# Patient Record
Sex: Female | Born: 1945 | Race: Black or African American | Hispanic: No | Marital: Married | Smoking: Never smoker
Health system: Southern US, Community
[De-identification: ages and names within clinical notes are randomized; demographics above are authoritative.]

## PROBLEM LIST (undated history)

## (undated) DIAGNOSIS — D649 Anemia, unspecified: Secondary | ICD-10-CM

## (undated) DIAGNOSIS — E039 Hypothyroidism, unspecified: Secondary | ICD-10-CM

## (undated) DIAGNOSIS — I10 Essential (primary) hypertension: Secondary | ICD-10-CM

## (undated) DIAGNOSIS — G709 Myoneural disorder, unspecified: Secondary | ICD-10-CM

## (undated) DIAGNOSIS — E119 Type 2 diabetes mellitus without complications: Secondary | ICD-10-CM

---

## 2001-11-25 ENCOUNTER — Ambulatory Visit (HOSPITAL_COMMUNITY): Admission: RE | Admit: 2001-11-25 | Discharge: 2001-11-25 | Payer: Self-pay | Admitting: Gastroenterology

## 2005-12-04 ENCOUNTER — Encounter: Admission: RE | Admit: 2005-12-04 | Discharge: 2005-12-04 | Payer: Self-pay | Admitting: Internal Medicine

## 2005-12-26 ENCOUNTER — Encounter: Admission: RE | Admit: 2005-12-26 | Discharge: 2005-12-26 | Payer: Self-pay | Admitting: Internal Medicine

## 2010-06-17 ENCOUNTER — Encounter: Payer: Self-pay | Admitting: Internal Medicine

## 2010-07-20 ENCOUNTER — Encounter (HOSPITAL_COMMUNITY)
Admission: RE | Admit: 2010-07-20 | Discharge: 2010-07-20 | Disposition: A | Discharge status: Home / Self Care | Payer: Self-pay | Source: Ambulatory Visit

## 2010-07-20 ENCOUNTER — Encounter (HOSPITAL_COMMUNITY)
Admission: RE | Admit: 2010-07-20 | Discharge: 2010-07-20 | Disposition: A | Discharge status: Home / Self Care | Payer: Self-pay | Source: Ambulatory Visit | Attending: Ophthalmology | Admitting: Ophthalmology

## 2010-07-20 ENCOUNTER — Inpatient Hospital Stay (INDEPENDENT_AMBULATORY_CARE_PROVIDER_SITE_OTHER)
Admission: RE | Admit: 2010-07-20 | Discharge: 2010-07-20 | Disposition: A | Discharge status: Home / Self Care | Payer: Self-pay | Source: Ambulatory Visit | Attending: Family Medicine | Admitting: Family Medicine

## 2010-07-20 ENCOUNTER — Emergency Department (HOSPITAL_COMMUNITY)
Admission: EM | Admit: 2010-07-20 | Discharge: 2010-07-21 | Disposition: A | Discharge status: Home / Self Care | Payer: Self-pay | Attending: Emergency Medicine | Admitting: Emergency Medicine

## 2010-07-20 ENCOUNTER — Other Ambulatory Visit (HOSPITAL_COMMUNITY): Payer: Self-pay | Admitting: Ophthalmology

## 2010-07-20 DIAGNOSIS — Z01811 Encounter for preprocedural respiratory examination: Secondary | ICD-10-CM

## 2010-07-20 DIAGNOSIS — E119 Type 2 diabetes mellitus without complications: Secondary | ICD-10-CM | POA: Insufficient documentation

## 2010-07-20 DIAGNOSIS — Z01812 Encounter for preprocedural laboratory examination: Secondary | ICD-10-CM | POA: Insufficient documentation

## 2010-07-20 DIAGNOSIS — Z01810 Encounter for preprocedural cardiovascular examination: Secondary | ICD-10-CM | POA: Insufficient documentation

## 2010-07-20 DIAGNOSIS — Z01818 Encounter for other preprocedural examination: Secondary | ICD-10-CM | POA: Insufficient documentation

## 2010-07-20 DIAGNOSIS — I10 Essential (primary) hypertension: Secondary | ICD-10-CM | POA: Insufficient documentation

## 2010-07-20 DIAGNOSIS — R7989 Other specified abnormal findings of blood chemistry: Secondary | ICD-10-CM

## 2010-07-20 LAB — BASIC METABOLIC PANEL
BUN: 18 mg/dL (ref 6–23)
BUN: 15 mg/dL (ref 6–23)
CO2: 24 mEq/L (ref 19–32)
CO2: 22 mEq/L (ref 19–32)
Calcium: 10 mg/dL (ref 8.4–10.5)
Calcium: 9.3 mg/dL (ref 8.4–10.5)
Chloride: 98 mEq/L (ref 96–112)
Creatinine, Ser: 0.86 mg/dL (ref 0.4–1.2)
GFR calc Af Amer: >60 mL/min (ref >60)
GFR calc non Af Amer: >60 mL/min (ref >60)
Glucose, Bld: 472 mg/dL — ABNORMAL HIGH (ref 70–99)
Potassium: 4.7 mEq/L (ref 3.5–5.1)
Sodium: 132 mEq/L — ABNORMAL LOW (ref 135–145)

## 2010-07-20 LAB — CBC
HCT: 36.9 % (ref 36.0–46.0)
Hemoglobin: 13.2 g/dL (ref 12.0–15.0)
MCH: 31 pg (ref 26.0–34.0)
MCHC: 35.2 g/dL (ref 30.0–36.0)
MCV: 87.2 fL (ref 78.0–100.0)
RBC: 4.29 MIL/uL (ref 3.87–5.11)
RDW: 13 % (ref 11.5–15.5)
WBC: 8 10*3/uL (ref 4.0–10.5)

## 2010-07-20 LAB — URINALYSIS, ROUTINE W REFLEX MICROSCOPIC
Bilirubin Urine: NEGATIVE
Ketones, ur: 15 mg/dL — AB
Protein, ur: 30 mg/dL — AB
Urine Glucose, Fasting: >1000 mg/dL — AB

## 2010-07-20 LAB — SURGICAL PCR SCREEN: Staphylococcus aureus: NEGATIVE

## 2010-07-20 LAB — POCT I-STAT 3, ART BLOOD GAS (G3+)
O2 Saturation: 95 %
Patient temperature: 98.6
pCO2 arterial: 37.3 mmHg (ref 35.0–45.0)
pO2, Arterial: 72 mmHg — ABNORMAL LOW (ref 80.0–100.0)

## 2010-07-20 LAB — DIFFERENTIAL
Basophils Absolute: 0.1 10*3/uL (ref 0.0–0.1)
Eosinophils Absolute: 0 10*3/uL (ref 0.0–0.7)
Eosinophils Relative: 0 % (ref 0–5)
Lymphocytes Relative: 57 % — ABNORMAL HIGH (ref 12–46)
Lymphs Abs: 4.6 10*3/uL — ABNORMAL HIGH (ref 0.7–4.0)

## 2010-07-20 LAB — URINE MICROSCOPIC-ADD ON

## 2010-07-20 LAB — GLUCOSE, CAPILLARY: Glucose-Capillary: 386 mg/dL — ABNORMAL HIGH (ref 70–99)

## 2010-07-20 LAB — POCT CARDIAC MARKERS
CKMB, poc: 2.7 ng/mL (ref 1.0–8.0)
Myoglobin, poc: 128 ng/mL (ref 12–200)

## 2010-07-23 ENCOUNTER — Ambulatory Visit (HOSPITAL_COMMUNITY)
Admission: RE | Admit: 2010-07-23 | Discharge: 2010-07-23 | Disposition: A | Discharge status: Home / Self Care | Payer: Self-pay | Source: Ambulatory Visit | Attending: Ophthalmology | Admitting: Ophthalmology

## 2010-07-23 DIAGNOSIS — T8529XA Other mechanical complication of intraocular lens, initial encounter: Secondary | ICD-10-CM | POA: Insufficient documentation

## 2010-07-23 DIAGNOSIS — Y831 Surgical operation with implant of artificial internal device as the cause of abnormal reaction of the patient, or of later complication, without mention of misadventure at the time of the procedure: Secondary | ICD-10-CM | POA: Insufficient documentation

## 2010-07-23 DIAGNOSIS — R7309 Other abnormal glucose: Secondary | ICD-10-CM | POA: Insufficient documentation

## 2010-07-23 DIAGNOSIS — Z01810 Encounter for preprocedural cardiovascular examination: Secondary | ICD-10-CM | POA: Insufficient documentation

## 2010-07-23 DIAGNOSIS — Z01818 Encounter for other preprocedural examination: Secondary | ICD-10-CM | POA: Insufficient documentation

## 2010-07-23 DIAGNOSIS — Z01812 Encounter for preprocedural laboratory examination: Secondary | ICD-10-CM | POA: Insufficient documentation

## 2010-07-23 DIAGNOSIS — Z5309 Procedure and treatment not carried out because of other contraindication: Secondary | ICD-10-CM | POA: Insufficient documentation

## 2010-07-23 LAB — GLUCOSE, CAPILLARY: Glucose-Capillary: 395 mg/dL — ABNORMAL HIGH (ref 70–99)

## 2010-10-25 ENCOUNTER — Encounter (HOSPITAL_COMMUNITY)
Admission: RE | Admit: 2010-10-25 | Discharge: 2010-10-25 | Disposition: A | Discharge status: Home / Self Care | Payer: Self-pay | Source: Ambulatory Visit | Attending: Ophthalmology | Admitting: Ophthalmology

## 2010-10-25 DIAGNOSIS — Z01812 Encounter for preprocedural laboratory examination: Secondary | ICD-10-CM | POA: Insufficient documentation

## 2010-10-25 DIAGNOSIS — Z01818 Encounter for other preprocedural examination: Secondary | ICD-10-CM | POA: Insufficient documentation

## 2010-10-25 LAB — BASIC METABOLIC PANEL
CO2: 27 mEq/L (ref 19–32)
Calcium: 9.9 mg/dL (ref 8.4–10.5)
Chloride: 99 mEq/L (ref 96–112)
Creatinine, Ser: 0.82 mg/dL (ref 0.4–1.2)
Glucose, Bld: 221 mg/dL — ABNORMAL HIGH (ref 70–99)
Potassium: 4.3 mEq/L (ref 3.5–5.1)

## 2010-10-25 LAB — CBC
MCH: 30.5 pg (ref 26.0–34.0)
MCHC: 34.6 g/dL (ref 30.0–36.0)
MCV: 88.1 fL (ref 78.0–100.0)
WBC: 10.4 10*3/uL (ref 4.0–10.5)

## 2010-10-25 LAB — SURGICAL PCR SCREEN: Staphylococcus aureus: NEGATIVE

## 2010-10-29 ENCOUNTER — Ambulatory Visit (HOSPITAL_COMMUNITY)
Admission: RE | Admit: 2010-10-29 | Discharge: 2010-10-29 | Disposition: A | Discharge status: Home / Self Care | Payer: Self-pay | Source: Ambulatory Visit | Attending: Ophthalmology | Admitting: Ophthalmology

## 2010-10-29 DIAGNOSIS — I10 Essential (primary) hypertension: Secondary | ICD-10-CM | POA: Insufficient documentation

## 2010-10-29 DIAGNOSIS — Y831 Surgical operation with implant of artificial internal device as the cause of abnormal reaction of the patient, or of later complication, without mention of misadventure at the time of the procedure: Secondary | ICD-10-CM | POA: Insufficient documentation

## 2010-10-29 DIAGNOSIS — E119 Type 2 diabetes mellitus without complications: Secondary | ICD-10-CM | POA: Insufficient documentation

## 2010-10-29 DIAGNOSIS — T8529XA Other mechanical complication of intraocular lens, initial encounter: Secondary | ICD-10-CM | POA: Insufficient documentation

## 2010-10-29 LAB — GLUCOSE, CAPILLARY
Glucose-Capillary: 197 mg/dL — ABNORMAL HIGH (ref 70–99)
Glucose-Capillary: 157 mg/dL — ABNORMAL HIGH (ref 70–99)

## 2010-11-14 NOTE — Op Note (Signed)
NAMEELLAJANE, STONG NO.:  [id number]  MEDICAL RECORD NO.:  [id number]  LOCATION:  [facility]                         FACILITY:  [facility]  PHYSICIAN:  Shade Flood, MD       DATE OF BIRTH:  08-Mar-1946  DATE OF PROCEDURE:  10/29/2010 DATE OF DISCHARGE:  10/29/2010                              OPERATIVE REPORT   PREOPERATIVE DIAGNOSIS:  Posterior dislocation of posterior chamber intraocular lens.  POSTOPERATIVE DIAGNOSIS:  Posterior dislocation of posterior chamber intraocular lens.  SURGEON:  Shade Flood, MD  PROCEDURE PERFORMED:  Pars plana vitrectomy with retrieval of posterior                       chamber intraocular lens,  secondary intraocular lens                       placement with suture fixation.   COMPLICATIONS: There were no complications  PATHOLOGY: no specimens for pathology  PROCEDURE: The patient's eye was prepared and draped in the usual fashion for ocular surgery on the right eye and a solid lid speculum was placed.  A trocar cannula was placed 3.5 mm posterior to the surgical limbus at 7:30 meridian.  The tip of the cannula was visualized in the vitreous cavity and the infusion line was allowed to flow and then clamped.  This was then attached to the cannula and secured to the drape.  Two additional trocar cannulas were placed at 9:30 and 2:30.  The light pipe and vitreous cutter were then inserted, and core vitrectomy was carried out.  The intraocular lens was  in part adherent to the vitreous base infero-temporally.   A peripheral clear corneal incision was made for 7 mm centered at the 12 o'clock meridian and then a keratome blade was used to enter the anterior chamber to permit delivery of the intraocular lens.  A 23-gauge forceps was then used to grasp the lens by its haptic and carefully retrieve it from the vitreous base.  The lens was removed from the vitreous base uneventfully.  The lens was then brought up to the iris plane and  a 20-gauge forceps was used to grasp it through the superior corneal incision and pupil.  It was delivered in that fashion from the eye through the anterior chamber.    Once the intraocular lens was removed, any residual vitreous was removed with the vitreous cutter preliminary to placing sutures.  Conjunctival peritomies were made from 1-2:30 and from 9:30-11 o'clock.  Sclera was cleaned and cauterized and two Chevron-shaped scleral flaps were dissected using a Beaver llamellar blade.  A 26-gauge needle was then placed underneath the flap through the sclera behind the iris and into the vitreous cavity. A trans-chamber 10-0 Prolene suture was then placed in similar position through the sclera on the opposite side of the eye and passed across the eye and threaded into the 26-gauge needle.  The 26g needle was withdrawn which served as a tract for the needle to come out of the temporal side of the eye.  The needle holder was then used grasp the transchamber  needle and the 10-0  prolene was left bridging the posterior pole.  A second trans-chamber needle was placed in similar fashion at the near side of the sclera underneath the flap.  Once the needle was retrieved on the other side, there were now two parallel 10-0 Prolene sutures bridging the eye.    A Sinskey lens hook was then used to bring both Prolene sutures out of the superior wound.  They were then severed and then tied to the intraocular lens haptic loops for suture placement.  The left side sutures were tied to the loop of one haptic and the temporal side sutures were tied to the loop of the other haptic.  Once these were secured, the intraocular lens was carefully reposited through the anterior chamber into the anterior most portion of the posterior chamber.  The Prolene sutures were then gradually brought up and the lens centered at the visual axis.  The Prolene sutures were then secured beneath the scleral flaps on each side  and the scleral flaps themselves were sewn closed with a 7-0 Vicryl suture.  Upon completion of this maneuver,  the cornea was sutured with interrupted 10-0 Prolene sutures.    The conjunctiva was then reapproximated using 6-0 plain gut suture, and the eye was checked for optimal pressure. The pressure was less than palpably.   The trocar cannulas at 9:30 and 2:30 were then removed with concomitant closure using a cotton tip applicator, and the infusion line was removed with concomitant closure with a cotton tip applicator.  The patient was given 100 mg of Ancef, irrigated subconjunctivally using an Olive tip cannula and 4 mg of Decadron irrigated subconjunctivally with an Olive tip cannula.  After completion of the injections, the lid speculums were removed and the patient's eye was patched using Polymyxin/Bacitracin ophthalmic ointment.  A plastic shield was placed and then she was uneventfully extubated and transferred to the recovery area with stable vital signs.          ______________________________ Shade Flood, MD     GG/MEDQ  D:  10/31/2010  T:  11/01/2010  Job:  [id number]  Electronically Signed by Shade Flood MD on 11/14/2010 03:42:31 PM

## 2012-05-27 IMAGING — CR DG CHEST 2V
2 series · 2 of 2 positions shown · non-contrast
Comparison: None.

CLINICAL DATA: Preop.

CHEST - 2 VIEW

[view not recorded (1 of 2)]
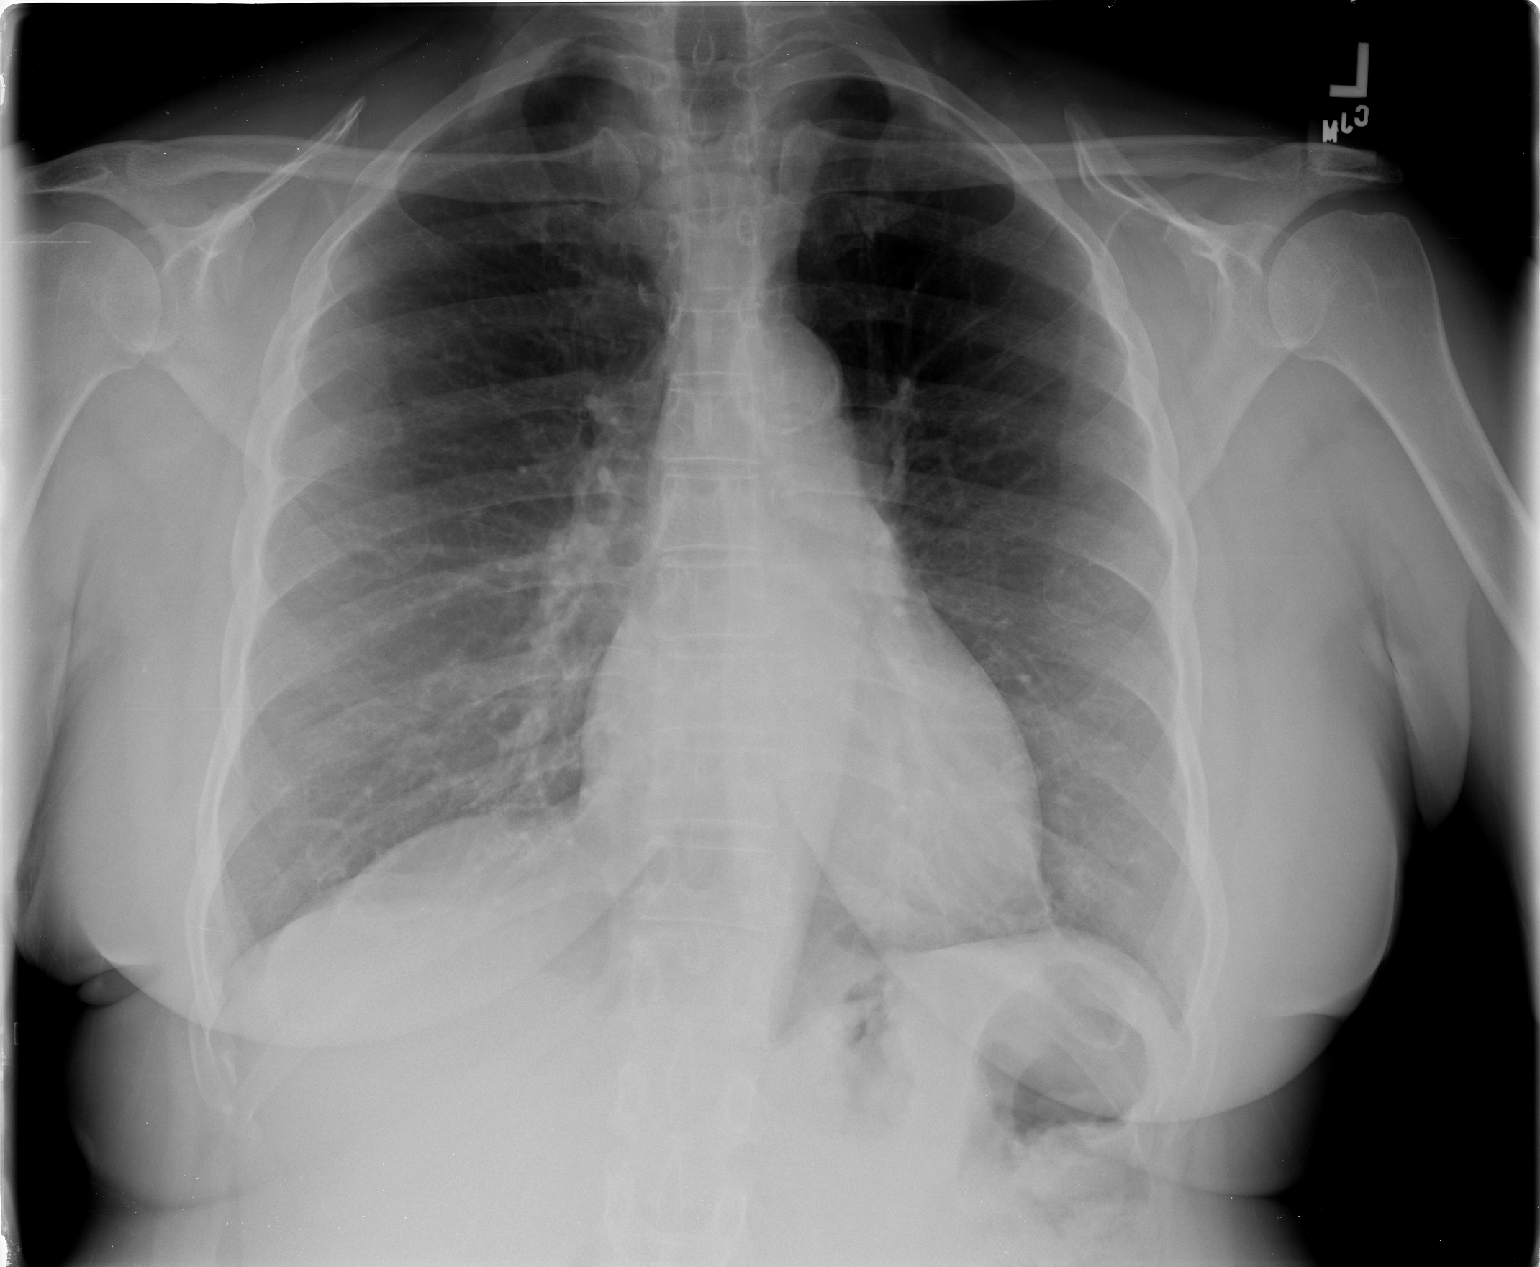

[view not recorded (2 of 2)]
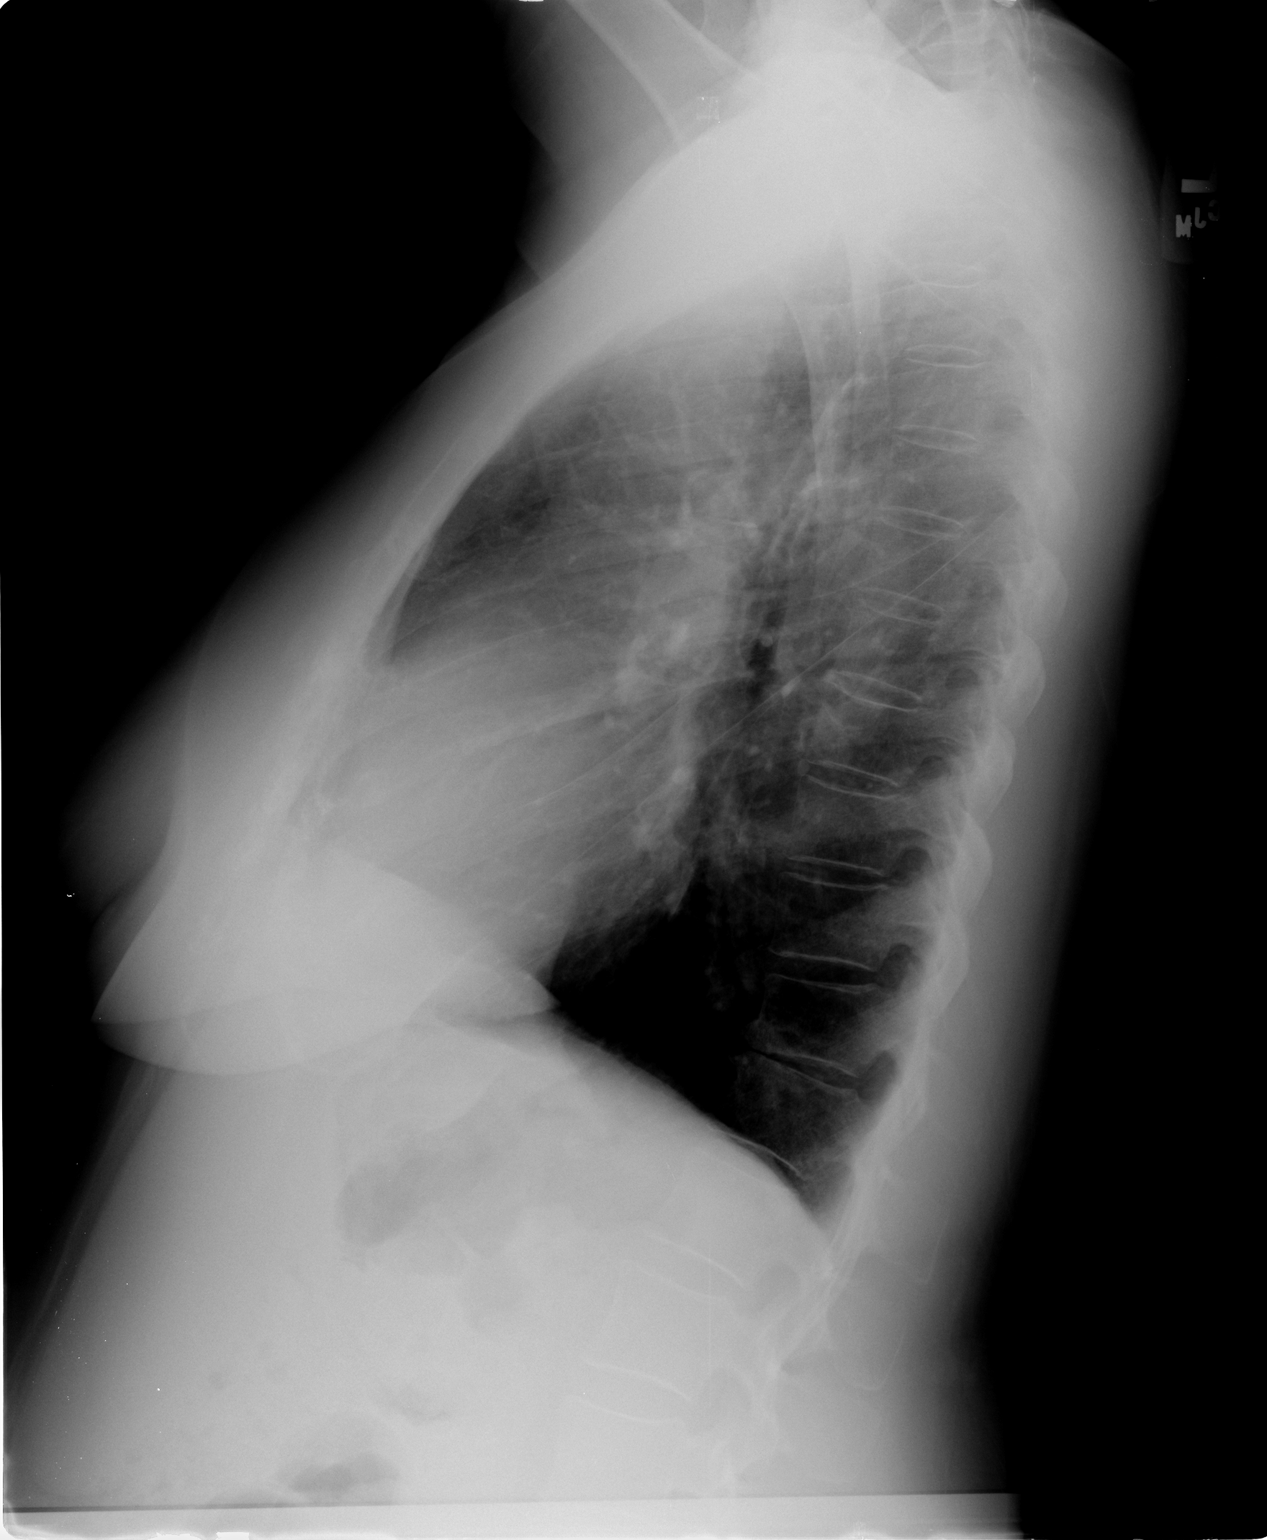

[2 of 2 positions shown; findings below may reference images not displayed]

FINDINGS: Trachea is midline.  Heart size normal.  Lungs are clear.
No pleural fluid.  Mild reversal of the normal thoracic kyphosis is
noted.
IMPRESSION: No acute findings.

## 2015-09-20 DIAGNOSIS — E113211 Type 2 diabetes mellitus with mild nonproliferative diabetic retinopathy with macular edema, right eye: Secondary | ICD-10-CM | POA: Diagnosis not present

## 2015-09-20 DIAGNOSIS — H353131 Nonexudative age-related macular degeneration, bilateral, early dry stage: Secondary | ICD-10-CM | POA: Diagnosis not present

## 2015-09-20 DIAGNOSIS — H35371 Puckering of macula, right eye: Secondary | ICD-10-CM | POA: Diagnosis not present

## 2015-09-20 DIAGNOSIS — H40053 Ocular hypertension, bilateral: Secondary | ICD-10-CM | POA: Diagnosis not present

## 2015-12-20 DIAGNOSIS — H353231 Exudative age-related macular degeneration, bilateral, with active choroidal neovascularization: Secondary | ICD-10-CM | POA: Diagnosis not present

## 2015-12-20 DIAGNOSIS — E083312 Diabetes mellitus due to underlying condition with moderate nonproliferative diabetic retinopathy with macular edema, left eye: Secondary | ICD-10-CM | POA: Diagnosis not present

## 2015-12-20 DIAGNOSIS — H35371 Puckering of macula, right eye: Secondary | ICD-10-CM | POA: Diagnosis not present

## 2015-12-20 DIAGNOSIS — H40051 Ocular hypertension, right eye: Secondary | ICD-10-CM | POA: Diagnosis not present

## 2016-04-16 DIAGNOSIS — Z23 Encounter for immunization: Secondary | ICD-10-CM | POA: Diagnosis not present

## 2016-04-16 DIAGNOSIS — E7800 Pure hypercholesterolemia, unspecified: Secondary | ICD-10-CM | POA: Diagnosis not present

## 2016-04-16 DIAGNOSIS — E1165 Type 2 diabetes mellitus with hyperglycemia: Secondary | ICD-10-CM | POA: Diagnosis not present

## 2016-04-16 DIAGNOSIS — E039 Hypothyroidism, unspecified: Secondary | ICD-10-CM | POA: Diagnosis not present

## 2016-04-16 DIAGNOSIS — I10 Essential (primary) hypertension: Secondary | ICD-10-CM | POA: Diagnosis not present

## 2016-04-16 DIAGNOSIS — Z0000 Encounter for general adult medical examination without abnormal findings: Secondary | ICD-10-CM | POA: Diagnosis not present

## 2016-06-20 DIAGNOSIS — Z780 Asymptomatic menopausal state: Secondary | ICD-10-CM | POA: Diagnosis not present

## 2016-07-18 DIAGNOSIS — I129 Hypertensive chronic kidney disease with stage 1 through stage 4 chronic kidney disease, or unspecified chronic kidney disease: Secondary | ICD-10-CM | POA: Diagnosis not present

## 2016-07-18 DIAGNOSIS — E039 Hypothyroidism, unspecified: Secondary | ICD-10-CM | POA: Diagnosis not present

## 2016-07-18 DIAGNOSIS — E1121 Type 2 diabetes mellitus with diabetic nephropathy: Secondary | ICD-10-CM | POA: Diagnosis not present

## 2016-07-18 DIAGNOSIS — N182 Chronic kidney disease, stage 2 (mild): Secondary | ICD-10-CM | POA: Diagnosis not present

## 2016-10-24 DIAGNOSIS — E039 Hypothyroidism, unspecified: Secondary | ICD-10-CM | POA: Diagnosis not present

## 2016-10-24 DIAGNOSIS — Z7984 Long term (current) use of oral hypoglycemic drugs: Secondary | ICD-10-CM | POA: Diagnosis not present

## 2016-10-24 DIAGNOSIS — I129 Hypertensive chronic kidney disease with stage 1 through stage 4 chronic kidney disease, or unspecified chronic kidney disease: Secondary | ICD-10-CM | POA: Diagnosis not present

## 2016-10-24 DIAGNOSIS — E1121 Type 2 diabetes mellitus with diabetic nephropathy: Secondary | ICD-10-CM | POA: Diagnosis not present

## 2016-10-30 DIAGNOSIS — I10 Essential (primary) hypertension: Secondary | ICD-10-CM | POA: Diagnosis not present

## 2016-10-30 DIAGNOSIS — E1165 Type 2 diabetes mellitus with hyperglycemia: Secondary | ICD-10-CM | POA: Diagnosis not present

## 2016-12-26 DIAGNOSIS — B9681 Helicobacter pylori [H. pylori] as the cause of diseases classified elsewhere: Secondary | ICD-10-CM | POA: Diagnosis not present

## 2016-12-26 DIAGNOSIS — K2940 Chronic atrophic gastritis without bleeding: Secondary | ICD-10-CM | POA: Diagnosis not present

## 2016-12-26 DIAGNOSIS — K317 Polyp of stomach and duodenum: Secondary | ICD-10-CM | POA: Diagnosis not present

## 2016-12-26 DIAGNOSIS — Q399 Congenital malformation of esophagus, unspecified: Secondary | ICD-10-CM | POA: Diagnosis not present

## 2016-12-26 DIAGNOSIS — K2950 Unspecified chronic gastritis without bleeding: Secondary | ICD-10-CM | POA: Diagnosis not present

## 2016-12-26 DIAGNOSIS — K5730 Diverticulosis of large intestine without perforation or abscess without bleeding: Secondary | ICD-10-CM | POA: Diagnosis not present

## 2016-12-26 DIAGNOSIS — Z86010 Personal history of colonic polyps: Secondary | ICD-10-CM | POA: Diagnosis not present

## 2016-12-26 DIAGNOSIS — R634 Abnormal weight loss: Secondary | ICD-10-CM | POA: Diagnosis not present

## 2016-12-31 DIAGNOSIS — K2950 Unspecified chronic gastritis without bleeding: Secondary | ICD-10-CM | POA: Diagnosis not present

## 2016-12-31 DIAGNOSIS — K317 Polyp of stomach and duodenum: Secondary | ICD-10-CM | POA: Diagnosis not present

## 2016-12-31 DIAGNOSIS — B9681 Helicobacter pylori [H. pylori] as the cause of diseases classified elsewhere: Secondary | ICD-10-CM | POA: Diagnosis not present

## 2017-01-07 DIAGNOSIS — Z7984 Long term (current) use of oral hypoglycemic drugs: Secondary | ICD-10-CM | POA: Diagnosis not present

## 2017-01-07 DIAGNOSIS — K2970 Gastritis, unspecified, without bleeding: Secondary | ICD-10-CM | POA: Diagnosis not present

## 2017-01-07 DIAGNOSIS — E1165 Type 2 diabetes mellitus with hyperglycemia: Secondary | ICD-10-CM | POA: Diagnosis not present

## 2017-01-07 DIAGNOSIS — I129 Hypertensive chronic kidney disease with stage 1 through stage 4 chronic kidney disease, or unspecified chronic kidney disease: Secondary | ICD-10-CM | POA: Diagnosis not present

## 2017-01-07 DIAGNOSIS — Z86010 Personal history of colonic polyps: Secondary | ICD-10-CM | POA: Diagnosis not present

## 2017-01-21 DIAGNOSIS — Z7984 Long term (current) use of oral hypoglycemic drugs: Secondary | ICD-10-CM | POA: Diagnosis not present

## 2017-01-21 DIAGNOSIS — I129 Hypertensive chronic kidney disease with stage 1 through stage 4 chronic kidney disease, or unspecified chronic kidney disease: Secondary | ICD-10-CM | POA: Diagnosis not present

## 2017-01-21 DIAGNOSIS — E1165 Type 2 diabetes mellitus with hyperglycemia: Secondary | ICD-10-CM | POA: Diagnosis not present

## 2017-05-26 DIAGNOSIS — H401131 Primary open-angle glaucoma, bilateral, mild stage: Secondary | ICD-10-CM | POA: Diagnosis not present

## 2017-05-26 DIAGNOSIS — H353131 Nonexudative age-related macular degeneration, bilateral, early dry stage: Secondary | ICD-10-CM | POA: Diagnosis not present

## 2017-05-26 DIAGNOSIS — E113312 Type 2 diabetes mellitus with moderate nonproliferative diabetic retinopathy with macular edema, left eye: Secondary | ICD-10-CM | POA: Diagnosis not present

## 2017-05-26 DIAGNOSIS — H35371 Puckering of macula, right eye: Secondary | ICD-10-CM | POA: Diagnosis not present

## 2017-05-26 DIAGNOSIS — H26492 Other secondary cataract, left eye: Secondary | ICD-10-CM | POA: Diagnosis not present

## 2017-06-30 DIAGNOSIS — E113292 Type 2 diabetes mellitus with mild nonproliferative diabetic retinopathy without macular edema, left eye: Secondary | ICD-10-CM | POA: Diagnosis not present

## 2017-06-30 DIAGNOSIS — H26492 Other secondary cataract, left eye: Secondary | ICD-10-CM | POA: Diagnosis not present

## 2017-06-30 DIAGNOSIS — E133293 Other specified diabetes mellitus with mild nonproliferative diabetic retinopathy without macular edema, bilateral: Secondary | ICD-10-CM | POA: Diagnosis not present

## 2017-06-30 DIAGNOSIS — Z961 Presence of intraocular lens: Secondary | ICD-10-CM | POA: Diagnosis not present

## 2017-06-30 DIAGNOSIS — H35371 Puckering of macula, right eye: Secondary | ICD-10-CM | POA: Diagnosis not present

## 2017-07-14 DIAGNOSIS — E113312 Type 2 diabetes mellitus with moderate nonproliferative diabetic retinopathy with macular edema, left eye: Secondary | ICD-10-CM | POA: Diagnosis not present

## 2017-07-14 DIAGNOSIS — H35371 Puckering of macula, right eye: Secondary | ICD-10-CM | POA: Diagnosis not present

## 2017-07-14 DIAGNOSIS — H353131 Nonexudative age-related macular degeneration, bilateral, early dry stage: Secondary | ICD-10-CM | POA: Diagnosis not present

## 2017-07-29 DIAGNOSIS — R319 Hematuria, unspecified: Secondary | ICD-10-CM | POA: Diagnosis not present

## 2017-07-29 DIAGNOSIS — N182 Chronic kidney disease, stage 2 (mild): Secondary | ICD-10-CM | POA: Diagnosis not present

## 2017-07-29 DIAGNOSIS — E1165 Type 2 diabetes mellitus with hyperglycemia: Secondary | ICD-10-CM | POA: Diagnosis not present

## 2017-07-29 DIAGNOSIS — E1121 Type 2 diabetes mellitus with diabetic nephropathy: Secondary | ICD-10-CM | POA: Diagnosis not present

## 2017-07-29 DIAGNOSIS — E039 Hypothyroidism, unspecified: Secondary | ICD-10-CM | POA: Diagnosis not present

## 2017-07-29 DIAGNOSIS — E7800 Pure hypercholesterolemia, unspecified: Secondary | ICD-10-CM | POA: Diagnosis not present

## 2017-07-29 DIAGNOSIS — I129 Hypertensive chronic kidney disease with stage 1 through stage 4 chronic kidney disease, or unspecified chronic kidney disease: Secondary | ICD-10-CM | POA: Diagnosis not present

## 2017-07-29 DIAGNOSIS — Z7984 Long term (current) use of oral hypoglycemic drugs: Secondary | ICD-10-CM | POA: Diagnosis not present

## 2017-08-18 DIAGNOSIS — E113312 Type 2 diabetes mellitus with moderate nonproliferative diabetic retinopathy with macular edema, left eye: Secondary | ICD-10-CM | POA: Diagnosis not present

## 2017-09-22 DIAGNOSIS — E113312 Type 2 diabetes mellitus with moderate nonproliferative diabetic retinopathy with macular edema, left eye: Secondary | ICD-10-CM | POA: Diagnosis not present

## 2017-10-14 DIAGNOSIS — E039 Hypothyroidism, unspecified: Secondary | ICD-10-CM | POA: Diagnosis not present

## 2017-10-14 DIAGNOSIS — N182 Chronic kidney disease, stage 2 (mild): Secondary | ICD-10-CM | POA: Diagnosis not present

## 2017-10-14 DIAGNOSIS — E1165 Type 2 diabetes mellitus with hyperglycemia: Secondary | ICD-10-CM | POA: Diagnosis not present

## 2017-10-14 DIAGNOSIS — I129 Hypertensive chronic kidney disease with stage 1 through stage 4 chronic kidney disease, or unspecified chronic kidney disease: Secondary | ICD-10-CM | POA: Diagnosis not present

## 2017-10-14 DIAGNOSIS — E7800 Pure hypercholesterolemia, unspecified: Secondary | ICD-10-CM | POA: Diagnosis not present

## 2017-11-03 DIAGNOSIS — E113312 Type 2 diabetes mellitus with moderate nonproliferative diabetic retinopathy with macular edema, left eye: Secondary | ICD-10-CM | POA: Diagnosis not present

## 2017-12-15 DIAGNOSIS — E113312 Type 2 diabetes mellitus with moderate nonproliferative diabetic retinopathy with macular edema, left eye: Secondary | ICD-10-CM | POA: Diagnosis not present

## 2018-01-19 DIAGNOSIS — E113312 Type 2 diabetes mellitus with moderate nonproliferative diabetic retinopathy with macular edema, left eye: Secondary | ICD-10-CM | POA: Diagnosis not present

## 2018-02-23 DIAGNOSIS — E113312 Type 2 diabetes mellitus with moderate nonproliferative diabetic retinopathy with macular edema, left eye: Secondary | ICD-10-CM | POA: Diagnosis not present

## 2018-04-21 DIAGNOSIS — I129 Hypertensive chronic kidney disease with stage 1 through stage 4 chronic kidney disease, or unspecified chronic kidney disease: Secondary | ICD-10-CM | POA: Diagnosis not present

## 2018-04-21 DIAGNOSIS — E1121 Type 2 diabetes mellitus with diabetic nephropathy: Secondary | ICD-10-CM | POA: Diagnosis not present

## 2018-04-21 DIAGNOSIS — E7800 Pure hypercholesterolemia, unspecified: Secondary | ICD-10-CM | POA: Diagnosis not present

## 2018-04-21 DIAGNOSIS — N182 Chronic kidney disease, stage 2 (mild): Secondary | ICD-10-CM | POA: Diagnosis not present

## 2018-04-21 DIAGNOSIS — Z86010 Personal history of colonic polyps: Secondary | ICD-10-CM | POA: Diagnosis not present

## 2018-04-21 DIAGNOSIS — E1165 Type 2 diabetes mellitus with hyperglycemia: Secondary | ICD-10-CM | POA: Diagnosis not present

## 2018-04-21 DIAGNOSIS — Z23 Encounter for immunization: Secondary | ICD-10-CM | POA: Diagnosis not present

## 2018-04-21 DIAGNOSIS — E039 Hypothyroidism, unspecified: Secondary | ICD-10-CM | POA: Diagnosis not present

## 2018-04-21 DIAGNOSIS — Z1159 Encounter for screening for other viral diseases: Secondary | ICD-10-CM | POA: Diagnosis not present

## 2018-04-21 DIAGNOSIS — K2970 Gastritis, unspecified, without bleeding: Secondary | ICD-10-CM | POA: Diagnosis not present

## 2018-04-21 DIAGNOSIS — Z0000 Encounter for general adult medical examination without abnormal findings: Secondary | ICD-10-CM | POA: Diagnosis not present

## 2018-07-20 DIAGNOSIS — E083312 Diabetes mellitus due to underlying condition with moderate nonproliferative diabetic retinopathy with macular edema, left eye: Secondary | ICD-10-CM | POA: Diagnosis not present

## 2018-09-15 DIAGNOSIS — E113312 Type 2 diabetes mellitus with moderate nonproliferative diabetic retinopathy with macular edema, left eye: Secondary | ICD-10-CM | POA: Diagnosis not present

## 2018-10-27 DIAGNOSIS — I129 Hypertensive chronic kidney disease with stage 1 through stage 4 chronic kidney disease, or unspecified chronic kidney disease: Secondary | ICD-10-CM | POA: Diagnosis not present

## 2018-10-27 DIAGNOSIS — E1121 Type 2 diabetes mellitus with diabetic nephropathy: Secondary | ICD-10-CM | POA: Diagnosis not present

## 2018-10-27 DIAGNOSIS — E7800 Pure hypercholesterolemia, unspecified: Secondary | ICD-10-CM | POA: Diagnosis not present

## 2018-10-27 DIAGNOSIS — E039 Hypothyroidism, unspecified: Secondary | ICD-10-CM | POA: Diagnosis not present

## 2018-10-27 DIAGNOSIS — N182 Chronic kidney disease, stage 2 (mild): Secondary | ICD-10-CM | POA: Diagnosis not present

## 2019-02-16 DIAGNOSIS — L309 Dermatitis, unspecified: Secondary | ICD-10-CM | POA: Diagnosis not present

## 2019-02-16 DIAGNOSIS — B029 Zoster without complications: Secondary | ICD-10-CM | POA: Diagnosis not present

## 2019-02-23 ENCOUNTER — Encounter: Payer: Self-pay | Admitting: Emergency Medicine

## 2019-02-23 ENCOUNTER — Other Ambulatory Visit: Payer: Self-pay

## 2019-02-23 ENCOUNTER — Inpatient Hospital Stay (HOSPITAL_COMMUNITY)
Admission: EM | Admit: 2019-02-23 | Discharge: 2019-03-01 | DRG: 872 | Disposition: A | Discharge status: Home / Self Care | Payer: PPO | Attending: Internal Medicine | Admitting: Internal Medicine

## 2019-02-23 ENCOUNTER — Emergency Department (HOSPITAL_COMMUNITY): Payer: PPO

## 2019-02-23 DIAGNOSIS — R7881 Bacteremia: Secondary | ICD-10-CM | POA: Diagnosis present

## 2019-02-23 DIAGNOSIS — B954 Other streptococcus as the cause of diseases classified elsewhere: Secondary | ICD-10-CM | POA: Diagnosis not present

## 2019-02-23 DIAGNOSIS — Z20828 Contact with and (suspected) exposure to other viral communicable diseases: Secondary | ICD-10-CM | POA: Diagnosis not present

## 2019-02-23 DIAGNOSIS — R6510 Systemic inflammatory response syndrome (SIRS) of non-infectious origin without acute organ dysfunction: Secondary | ICD-10-CM

## 2019-02-23 DIAGNOSIS — W19XXXA Unspecified fall, initial encounter: Secondary | ICD-10-CM | POA: Diagnosis present

## 2019-02-23 DIAGNOSIS — E039 Hypothyroidism, unspecified: Secondary | ICD-10-CM | POA: Diagnosis present

## 2019-02-23 DIAGNOSIS — B955 Unspecified streptococcus as the cause of diseases classified elsewhere: Secondary | ICD-10-CM | POA: Diagnosis not present

## 2019-02-23 DIAGNOSIS — Z79890 Hormone replacement therapy (postmenopausal): Secondary | ICD-10-CM | POA: Diagnosis not present

## 2019-02-23 DIAGNOSIS — Z79899 Other long term (current) drug therapy: Secondary | ICD-10-CM

## 2019-02-23 DIAGNOSIS — H9190 Unspecified hearing loss, unspecified ear: Secondary | ICD-10-CM | POA: Diagnosis not present

## 2019-02-23 DIAGNOSIS — D649 Anemia, unspecified: Secondary | ICD-10-CM | POA: Diagnosis not present

## 2019-02-23 DIAGNOSIS — G629 Polyneuropathy, unspecified: Secondary | ICD-10-CM

## 2019-02-23 DIAGNOSIS — Z23 Encounter for immunization: Secondary | ICD-10-CM | POA: Diagnosis not present

## 2019-02-23 DIAGNOSIS — I10 Essential (primary) hypertension: Secondary | ICD-10-CM | POA: Diagnosis present

## 2019-02-23 DIAGNOSIS — B028 Zoster with other complications: Secondary | ICD-10-CM | POA: Diagnosis present

## 2019-02-23 DIAGNOSIS — B029 Zoster without complications: Secondary | ICD-10-CM | POA: Diagnosis not present

## 2019-02-23 DIAGNOSIS — R509 Fever, unspecified: Secondary | ICD-10-CM

## 2019-02-23 DIAGNOSIS — R531 Weakness: Secondary | ICD-10-CM | POA: Diagnosis not present

## 2019-02-23 DIAGNOSIS — B0222 Postherpetic trigeminal neuralgia: Secondary | ICD-10-CM | POA: Diagnosis not present

## 2019-02-23 DIAGNOSIS — E11620 Type 2 diabetes mellitus with diabetic dermatitis: Secondary | ICD-10-CM

## 2019-02-23 DIAGNOSIS — E1121 Type 2 diabetes mellitus with diabetic nephropathy: Secondary | ICD-10-CM | POA: Diagnosis not present

## 2019-02-23 DIAGNOSIS — Z7984 Long term (current) use of oral hypoglycemic drugs: Secondary | ICD-10-CM | POA: Diagnosis not present

## 2019-02-23 DIAGNOSIS — N179 Acute kidney failure, unspecified: Secondary | ICD-10-CM | POA: Diagnosis present

## 2019-02-23 DIAGNOSIS — A403 Sepsis due to Streptococcus pneumoniae: Secondary | ICD-10-CM | POA: Diagnosis not present

## 2019-02-23 DIAGNOSIS — K089 Disorder of teeth and supporting structures, unspecified: Secondary | ICD-10-CM

## 2019-02-23 DIAGNOSIS — E119 Type 2 diabetes mellitus without complications: Secondary | ICD-10-CM

## 2019-02-23 HISTORY — DX: Myoneural disorder, unspecified: G70.9

## 2019-02-23 HISTORY — DX: Type 2 diabetes mellitus without complications: E11.9

## 2019-02-23 HISTORY — DX: Essential (primary) hypertension: I10

## 2019-02-23 HISTORY — DX: Hypothyroidism, unspecified: E03.9

## 2019-02-23 HISTORY — DX: Anemia, unspecified: D64.9

## 2019-02-23 LAB — CBC WITH DIFFERENTIAL/PLATELET
Abs Immature Granulocytes: 0.17 10*3/uL — ABNORMAL HIGH (ref 0.00–0.07)
Basophils Absolute: 0.1 10*3/uL (ref 0.0–0.1)
Basophils Relative: 1 %
Eosinophils Absolute: 0 10*3/uL (ref 0.0–0.5)
Eosinophils Relative: 0 %
HCT: 34.1 % — ABNORMAL LOW (ref 36.0–46.0)
Hemoglobin: 10.5 g/dL — ABNORMAL LOW (ref 12.0–15.0)
Immature Granulocytes: 1 %
Lymphocytes Relative: 10 %
Lymphs Abs: 2.1 10*3/uL (ref 0.7–4.0)
MCH: 29 pg (ref 26.0–34.0)
MCHC: 30.8 g/dL (ref 30.0–36.0)
MCV: 94.2 fL (ref 80.0–100.0)
Monocytes Absolute: 0.9 10*3/uL (ref 0.1–1.0)
Monocytes Relative: 4 %
Neutro Abs: 18.2 10*3/uL — ABNORMAL HIGH (ref 1.7–7.7)
Neutrophils Relative %: 84 %
Platelets: 405 10*3/uL — ABNORMAL HIGH (ref 150–400)
RBC: 3.62 MIL/uL — ABNORMAL LOW (ref 3.87–5.11)
RDW: 13.6 % (ref 11.5–15.5)
WBC: 21.5 10*3/uL — ABNORMAL HIGH (ref 4.0–10.5)
nRBC: 0 % (ref 0.0–0.2)

## 2019-02-23 LAB — HEPATIC FUNCTION PANEL
ALT: 20 U/L (ref 0–44)
AST: 20 U/L (ref 15–41)
Albumin: 3.8 g/dL (ref 3.5–5.0)
Alkaline Phosphatase: 100 U/L (ref 38–126)
Bilirubin, Direct: 0.2 mg/dL (ref 0.0–0.2)
Indirect Bilirubin: 0.4 mg/dL (ref 0.3–0.9)
Total Bilirubin: 0.6 mg/dL (ref 0.3–1.2)
Total Protein: 9.2 g/dL — ABNORMAL HIGH (ref 6.5–8.1)

## 2019-02-23 LAB — URINALYSIS, ROUTINE W REFLEX MICROSCOPIC
Bacteria, UA: NONE SEEN
Bilirubin Urine: NEGATIVE
Glucose, UA: >=500 mg/dL — AB
Ketones, ur: 20 mg/dL — AB
Leukocytes,Ua: NEGATIVE
Nitrite: NEGATIVE
Protein, ur: 100 mg/dL — AB
Specific Gravity, Urine: 1.021 (ref 1.005–1.030)
pH: 5 (ref 5.0–8.0)

## 2019-02-23 LAB — BASIC METABOLIC PANEL
Anion gap: 16 — ABNORMAL HIGH (ref 5–15)
BUN: 27 mg/dL — ABNORMAL HIGH (ref 8–23)
CO2: 22 mmol/L (ref 22–32)
Calcium: 9.4 mg/dL (ref 8.9–10.3)
Chloride: 97 mmol/L — ABNORMAL LOW (ref 98–111)
Creatinine, Ser: 1.31 mg/dL — ABNORMAL HIGH (ref 0.44–1.00)
GFR calc Af Amer: 47 mL/min — ABNORMAL LOW (ref >60)
GFR calc non Af Amer: 41 mL/min — ABNORMAL LOW (ref >60)
Glucose, Bld: 179 mg/dL — ABNORMAL HIGH (ref 70–99)
Potassium: 4.3 mmol/L (ref 3.5–5.1)
Sodium: 135 mmol/L (ref 135–145)

## 2019-02-23 LAB — HEMOGLOBIN A1C
Hgb A1c MFr Bld: 7.4 % — ABNORMAL HIGH (ref 4.8–5.6)
Mean Plasma Glucose: 165.68 mg/dL

## 2019-02-23 LAB — LIPASE, BLOOD: Lipase: 17 U/L (ref 11–51)

## 2019-02-23 LAB — SARS CORONAVIRUS 2 BY RT PCR (HOSPITAL ORDER, PERFORMED IN ~~LOC~~ HOSPITAL LAB): SARS Coronavirus 2: NEGATIVE

## 2019-02-23 LAB — LACTIC ACID, PLASMA: Lactic Acid, Venous: 1.2 mmol/L (ref 0.5–1.9)

## 2019-02-23 LAB — GLUCOSE, CAPILLARY: Glucose-Capillary: 187 mg/dL — ABNORMAL HIGH (ref 70–99)

## 2019-02-23 MED ORDER — BRIMONIDINE TARTRATE-TIMOLOL 0.2-0.5 % OP SOLN: 1.0000 [drp] | Freq: Two times a day (BID) | OPHTHALMIC | Status: DC

## 2019-02-23 MED ORDER — ACETAMINOPHEN 650 MG RE SUPP: 650.0000 mg | Freq: Four times a day (QID) | RECTAL | Status: DC | PRN

## 2019-02-23 MED ORDER — ACETAMINOPHEN 325 MG PO TABS
650.0000 mg | ORAL_TABLET | Freq: Four times a day (QID) | ORAL | Status: DC | PRN
  Administered 2019-02-23 – 2019-02-28 (×5): 650 mg via ORAL
  Filled 2019-02-23 (×5): qty 2

## 2019-02-23 MED ORDER — SODIUM CHLORIDE 0.9 % IV SOLN
500.0000 mg | Freq: Once | INTRAVENOUS | Status: AC
  Administered 2019-02-23: 500 mg via INTRAVENOUS
  Filled 2019-02-23: qty 500

## 2019-02-23 MED ORDER — LEVOTHYROXINE SODIUM 50 MCG PO TABS
50.0000 ug | ORAL_TABLET | Freq: Every day | ORAL | Status: DC
  Administered 2019-02-23 – 2019-03-01 (×6): 50 ug via ORAL
  Filled 2019-02-23 (×6): qty 1

## 2019-02-23 MED ORDER — SODIUM CHLORIDE 0.9 % IV BOLUS
1000.0000 mL | Freq: Once | INTRAVENOUS | Status: AC
  Administered 2019-02-23: 1000 mL via INTRAVENOUS

## 2019-02-23 MED ORDER — CALAMINE EX LOTN
TOPICAL_LOTION | CUTANEOUS | Status: DC | PRN
  Administered 2019-02-24: 10:00:00 via TOPICAL
  Filled 2019-02-23: qty 118

## 2019-02-23 MED ORDER — GABAPENTIN 300 MG PO CAPS
300.0000 mg | ORAL_CAPSULE | Freq: Two times a day (BID) | ORAL | Status: DC
  Administered 2019-02-23 – 2019-03-01 (×12): 300 mg via ORAL
  Filled 2019-02-23 (×12): qty 1

## 2019-02-23 MED ORDER — SODIUM CHLORIDE 0.9% FLUSH
3.0000 mL | Freq: Two times a day (BID) | INTRAVENOUS | Status: DC
  Administered 2019-02-23 – 2019-03-01 (×10): 3 mL via INTRAVENOUS

## 2019-02-23 MED ORDER — SODIUM CHLORIDE 0.9 % IV SOLN
2.0000 g | Freq: Once | INTRAVENOUS | Status: AC
  Administered 2019-02-23: 2 g via INTRAVENOUS
  Filled 2019-02-23: qty 20

## 2019-02-23 MED ORDER — ACETAMINOPHEN 500 MG PO TABS
1000.0000 mg | ORAL_TABLET | Freq: Once | ORAL | Status: AC
  Administered 2019-02-23: 1000 mg via ORAL
  Filled 2019-02-23: qty 2

## 2019-02-23 MED ORDER — TIMOLOL MALEATE 0.5 % OP SOLN
1.0000 [drp] | Freq: Two times a day (BID) | OPHTHALMIC | Status: DC
  Administered 2019-02-24 (×2): 1 [drp] via OPHTHALMIC
  Filled 2019-02-23: qty 5

## 2019-02-23 MED ORDER — TRAMADOL HCL 50 MG PO TABS: 50.0000 mg | ORAL_TABLET | Freq: Four times a day (QID) | ORAL | Status: DC | PRN

## 2019-02-23 MED ORDER — INSULIN ASPART 100 UNIT/ML ~~LOC~~ SOLN
0.0000 [IU] | Freq: Three times a day (TID) | SUBCUTANEOUS | Status: DC
  Administered 2019-02-24: 2 [IU] via SUBCUTANEOUS
  Administered 2019-02-24 – 2019-02-25 (×3): 3 [IU] via SUBCUTANEOUS
  Administered 2019-02-26: 2 [IU] via SUBCUTANEOUS
  Administered 2019-02-26: 08:00:00 3 [IU] via SUBCUTANEOUS
  Administered 2019-02-27: 10:00:00 2 [IU] via SUBCUTANEOUS
  Administered 2019-02-27: 13:00:00 3 [IU] via SUBCUTANEOUS
  Administered 2019-02-27: 2 [IU] via SUBCUTANEOUS
  Administered 2019-02-28 (×2): 5 [IU] via SUBCUTANEOUS
  Administered 2019-02-28: 09:00:00 3 [IU] via SUBCUTANEOUS
  Administered 2019-03-01: 5 [IU] via SUBCUTANEOUS
  Administered 2019-03-01: 3 [IU] via SUBCUTANEOUS
  Filled 2019-02-23: qty 0.15

## 2019-02-23 MED ORDER — SENNOSIDES-DOCUSATE SODIUM 8.6-50 MG PO TABS: 1.0000 | ORAL_TABLET | Freq: Every evening | ORAL | Status: DC | PRN

## 2019-02-23 MED ORDER — ENOXAPARIN SODIUM 40 MG/0.4ML ~~LOC~~ SOLN
40.0000 mg | Freq: Every day | SUBCUTANEOUS | Status: DC
  Administered 2019-02-24 – 2019-03-01 (×6): 40 mg via SUBCUTANEOUS
  Filled 2019-02-23 (×7): qty 0.4

## 2019-02-23 MED ORDER — ONDANSETRON HCL 4 MG PO TABS: 4.0000 mg | ORAL_TABLET | Freq: Four times a day (QID) | ORAL | Status: DC | PRN

## 2019-02-23 MED ORDER — INSULIN ASPART 100 UNIT/ML ~~LOC~~ SOLN
0.0000 [IU] | Freq: Every day | SUBCUTANEOUS | Status: DC
  Filled 2019-02-23: qty 0.05

## 2019-02-23 MED ORDER — AMLODIPINE BESYLATE 5 MG PO TABS
5.0000 mg | ORAL_TABLET | Freq: Every day | ORAL | Status: DC
  Administered 2019-02-23 – 2019-02-24 (×2): 5 mg via ORAL
  Filled 2019-02-23 (×2): qty 1

## 2019-02-23 MED ORDER — LACTATED RINGERS IV SOLN
INTRAVENOUS | Status: DC
  Administered 2019-02-23 – 2019-02-28 (×5): via INTRAVENOUS

## 2019-02-23 MED ORDER — BRIMONIDINE TARTRATE 0.2 % OP SOLN
1.0000 [drp] | Freq: Two times a day (BID) | OPHTHALMIC | Status: DC
  Administered 2019-02-24 – 2019-03-01 (×11): 1 [drp] via OPHTHALMIC
  Filled 2019-02-23: qty 5

## 2019-02-23 MED ORDER — ONDANSETRON HCL 4 MG/2ML IJ SOLN: 4.0000 mg | Freq: Four times a day (QID) | INTRAMUSCULAR | Status: DC | PRN

## 2019-02-23 MED ORDER — HYDROXYZINE HCL 25 MG PO TABS
25.0000 mg | ORAL_TABLET | Freq: Three times a day (TID) | ORAL | Status: DC | PRN
  Administered 2019-02-23 – 2019-02-24 (×2): 25 mg via ORAL
  Filled 2019-02-23 (×2): qty 1

## 2019-02-23 NOTE — ED Provider Notes (Signed)
[facility] DEPT Provider Note   CSN: [id number] Arrival date & time: 02/23/19  0957     History   Chief Complaint Chief Complaint  Patient presents with  . Weakness    HPI Sally Ramirez is a 73 y.o. female.     The history is provided by the patient.  Weakness Severity:  Mild Onset quality:  Gradual Duration:  2 days Timing:  Constant Progression:  Unchanged Chronicity:  New Context: not dehydration and not urinary tract infection   Context comment:  Currently treated for shingles to the face. Feels weak all over, denies specific symptoms. Denies fever.  Relieved by:  Nothing Worsened by:  Nothing Associated symptoms: myalgias   Associated symptoms: no abdominal pain, no arthralgias, no chest pain, no cough, no dysuria, no fever, no seizures, no shortness of breath and no vomiting   Risk factors: diabetes     No past medical history on file.  There are no active problems to display for this patient.    OB History   No obstetric history on file.      Home Medications    Prior to Admission medications   Medication Sig Start Date End Date Taking? Authorizing Provider  amLODipine (NORVASC) 5 MG tablet Take 5 mg by mouth daily. 01/08/19   [provider]  augmented betamethasone dipropionate (DIPROLENE-AF) 0.05 % ointment Apply 1 application topically daily. 02/16/19   [provider]  gabapentin (NEURONTIN) 300 MG capsule Take 300 mg by mouth 2 (two) times daily. 02/22/19   [provider]  glimepiride (AMARYL) 4 MG tablet Take 4 mg by mouth daily. 01/29/19   [provider]  hydrochlorothiazide (HYDRODIURIL) 12.5 MG tablet Take 12.5 mg by mouth daily. 01/08/19   [provider]  JARDIANCE 25 MG TABS tablet Take 25 mg by mouth daily. 11/24/18   [provider]  levothyroxine (SYNTHROID) 50 MCG tablet Take 50 mcg by mouth daily. 01/08/19   [provider]  lisinopril (ZESTRIL)  20 MG tablet Take 20 mg by mouth daily. 12/21/18   [provider]  metFORMIN (GLUCOPHAGE) 1000 MG tablet Take 1,000 mg by mouth 2 (two) times daily. 01/08/19   [provider]  valACYclovir (VALTREX) 1000 MG tablet Take 1,000 mg by mouth 3 (three) times daily. 7 day supply 02/16/19   [provider]    Family History No family history on file.  Social History Social History   Tobacco Use  . Smoking status: Not on file  Substance Use Topics  . Alcohol use: Not on file  . Drug use: Not on file     Allergies   Patient has no allergy information on record.   Review of Systems Review of Systems  Constitutional: Negative for chills and fever.  HENT: Negative for ear pain and sore throat.   Eyes: Negative for pain and visual disturbance.  Respiratory: Negative for cough and shortness of breath.   Cardiovascular: Negative for chest pain and palpitations.  Gastrointestinal: Negative for abdominal pain and vomiting.  Genitourinary: Negative for dysuria and hematuria.  Musculoskeletal: Positive for myalgias. Negative for arthralgias and back pain.  Skin: Negative for color change and rash.  Neurological: Positive for weakness. Negative for seizures and syncope.  All other systems reviewed and are negative.    Physical Exam Updated Vital Signs BP 107/66   Pulse 95   Temp (!) 103.2 F (39.6 C) (Rectal)   Resp 17   SpO2 98%   Physical  Exam Vitals signs and nursing note reviewed.  Constitutional:      General: She is not in acute distress.    Appearance: She is well-developed. She is not ill-appearing.  HENT:     Head: Normocephalic and atraumatic.     Nose: Nose normal.     Mouth/Throat:     Mouth: Mucous membranes are moist.  Eyes:     Extraocular Movements: Extraocular movements intact.     Conjunctiva/sclera: Conjunctivae normal.     Pupils: Pupils are equal, round, and reactive to light.  Neck:     Musculoskeletal: Normal range of motion  and neck supple.  Cardiovascular:     Rate and Rhythm: Normal rate and regular rhythm.     Pulses: Normal pulses.     Heart sounds: Normal heart sounds. No murmur.  Pulmonary:     Effort: Pulmonary effort is normal. No respiratory distress.     Breath sounds: Normal breath sounds.  Abdominal:     General: Abdomen is flat.     Palpations: Abdomen is soft.     Tenderness: There is no abdominal tenderness.  Skin:    General: Skin is warm and dry.     Findings: Rash (crusted rash over the left side of face and ear) present.  Neurological:     General: No focal deficit present.     Mental Status: She is alert and oriented to person, place, and time.     Cranial Nerves: No cranial nerve deficit.     Sensory: No sensory deficit.     Motor: No weakness.     Coordination: Coordination normal.  Psychiatric:        Mood and Affect: Mood normal.      ED Treatments / Results  Labs (all labs ordered are listed, but only abnormal results are displayed) Labs Reviewed  CBC WITH DIFFERENTIAL/PLATELET - Abnormal; Notable for the following components:      Result Value   WBC 21.5 (*)    RBC 3.62 (*)    Hemoglobin 10.5 (*)    HCT 34.1 (*)    Platelets 405 (*)    Neutro Abs 18.2 (*)    Abs Immature Granulocytes 0.17 (*)    All other components within normal limits  BASIC METABOLIC PANEL - Abnormal; Notable for the following components:   Chloride 97 (*)    Glucose, Bld 179 (*)    BUN 27 (*)    Creatinine, Ser 1.31 (*)    GFR calc non Af Amer 41 (*)    GFR calc Af Amer 47 (*)    Anion gap 16 (*)    All other components within normal limits  HEPATIC FUNCTION PANEL - Abnormal; Notable for the following components:   Total Protein 9.2 (*)    All other components within normal limits  URINALYSIS, ROUTINE W REFLEX MICROSCOPIC - Abnormal; Notable for the following components:   Glucose, UA >=500 (*)    Hgb urine dipstick SMALL (*)    Ketones, ur 20 (*)    Protein, ur 100 (*)    All  other components within normal limits  SARS CORONAVIRUS 2 (HOSPITAL ORDER, PERFORMED IN [facility])  URINE CULTURE  CULTURE, BLOOD (ROUTINE X 2)  CULTURE, BLOOD (ROUTINE X 2)  LACTIC ACID, PLASMA  LIPASE, BLOOD  LACTIC ACID, PLASMA    EKG EKG Interpretation  Date/Time:  Tuesday February 23 2019 10:46:48 EDT Ventricular Rate:  115 PR Interval:    QRS Duration:  77 QT Interval:  296 QTC Calculation: 410 R Axis:   68 Text Interpretation:  Sinus tachycardia Consider left ventricular hypertrophy Confirmed by Virgina Norfolk [phone]) on 02/23/2019 11:24:26 AM   Radiology Dg Chest Portable 1 View  Result Date: 02/23/2019 CLINICAL DATA:  Weakness. EXAM: PORTABLE CHEST 1 VIEW COMPARISON:  July 20, 2010 FINDINGS: The heart size and mediastinal contours are within normal limits. Both lungs are clear. The visualized skeletal structures are unremarkable. IMPRESSION: No active disease. Electronically Signed   By: Gerome Sam III M.D   On: 02/23/2019 10:36    Procedures Procedures (including critical care time)  Medications Ordered in ED Medications  azithromycin (ZITHROMAX) 500 mg in sodium chloride 0.9 % 250 mL IVPB (500 mg Intravenous New Bag/Given 02/23/19 1531)  sodium chloride 0.9 % bolus 1,000 mL (has no administration in time range)  sodium chloride 0.9 % bolus 1,000 mL (0 mLs Intravenous Stopped 02/23/19 1304)  acetaminophen (TYLENOL) tablet 1,000 mg (1,000 mg Oral Given 02/23/19 1304)  cefTRIAXone (ROCEPHIN) 2 g in sodium chloride 0.9 % 100 mL IVPB (0 g Intravenous Stopped 02/23/19 1332)     Initial Impression / Assessment and Plan / ED Course  I have reviewed the triage vital signs and the nursing notes.  Pertinent labs & imaging results that were available during my care of the patient were reviewed by me and considered in my medical decision making (see chart for details).     Sally Ramirez is a 73 year old female history of diabetes who presents the ED  with generalized weakness.  Patient with fever and tachycardia upon arrival.  Has had body aches but denies any specific cough, abdominal pain, chest pain, shortness of breath, urinary symptoms.  Patient has shingles rash to the left side of the face that she is on antiviral.  Overall she appears well.  Will get lab work and start infectious work-up with chest x-ray, urine studies, COVID testing.  Will give IV fluids, Tylenol and reevaluate.  Less likely disseminated varicella.  Patient with leukocytosis.  Was given IV antibiotics with Rocephin and azithromycin.  However, no source for fever was found.  Chest x-ray showed no signs of infection.  Urinalysis negative for infection.  Coronavirus test was negative.  Overall, patient improvement following IV fluids.  Does not appear to have disseminated varicella.  Discussed this as well with infectious disease on-call.  Does not have any signs to suggest meningitis, no encephalopathy.  She is not on immunosuppressants.  Overall, believe patient may benefit from an observation stay to have cultures grown.  We will have hospitalist come evaluate the patient for possible admission.  Family also hoping no patient to be admitted as she has had increasing weakness.  Awaiting hospitalist evaluation and plan.  This chart was dictated using voice recognition software.  Despite best efforts to proofread,  errors can occur which can change the documentation meaning.    Final Clinical Impressions(s) / ED Diagnoses   Final diagnoses:  SIRS (systemic inflammatory response syndrome) (HCC)  Fever, unspecified fever cause    ED Discharge Orders    None       Virgina Norfolk, DO 02/23/19 1632

## 2019-02-23 NOTE — ED Triage Notes (Signed)
Pt reports weakness over the last 2 weeks. Pt states that she was recently d/c with shingles and states "I would have bounced back by now."

## 2019-02-23 NOTE — H&P (Signed)
[facility] History and Physical   Patient: Sally Ramirez ZLD:[id number]   PCP: Patient, No Pcp Per DOB: [birth year]   DOA: 02/23/2019   DOS: 02/23/2019   DOS: the patient was seen and examined on 02/23/2019  Patient coming from: The patient is coming from Home  Chief Complaint: Fall and fever with generalized weakness  HPI: Sally Ramirez is a 73 y.o. female with Past medical history of HTN, neuropathy, type II DM, hypothyroidism recent shingles. Patient was brought in secondary to fall and generalized weakness. History was obtained with the help of patient's sister as well as patient. Patient progressively has been having generalized weakness for last 2 to 3 weeks. Recently patient was diagnosed with shingles involving left trigeminal nerve. There is only one dermatome involved. Patient was treated with Valtrex and is actually getting better. Patient has significant itching which causes bleeding from her ear. Due to pain involving her left ear patient has been having progressively poor oral intake. No significant weight loss reported by the family no nausea no vomiting or diarrhea. Patient at her baseline is fairly active and ambulating and independent. But currently is requiring significant assistance for mobility. This morning when sister arrived at home when patient was trying to reach the door she slid down to the ground and was unable to stand up on her own. Patient had to crawl to the door to open it. Therefore patient was brought to the hospital. At the time of my evaluation patient denied any focal deficit.  ED Course: Found febrile had leukocytosis.  EDP discussed with ID regarding her shingles and felt that there is no indication for further treatment or change in modality of treatment for her shingles.  At her baseline ambulates without assistance independent for most of her ADL;  manages her medication on her own.  Review of Systems: as mentioned in the history of  present illness.  All other systems reviewed and are negative.  Family history reviewed and not pertinent   Prior to Admission medications   Medication Sig Start Date End Date Taking? Authorizing Provider  amLODipine (NORVASC) 5 MG tablet Take 5 mg by mouth daily. 01/08/19  Yes [provider]  augmented betamethasone dipropionate (DIPROLENE-AF) 0.05 % ointment Apply 1 application topically daily. 02/16/19  Yes [provider]  COMBIGAN 0.2-0.5 % ophthalmic solution Place 1 drop into both eyes 2 (two) times daily.  01/27/19  Yes [provider]  gabapentin (NEURONTIN) 300 MG capsule Take 300 mg by mouth 2 (two) times daily. 02/22/19  Yes [provider]  glimepiride (AMARYL) 4 MG tablet Take 4 mg by mouth daily. 01/29/19  Yes [provider]  hydrochlorothiazide (HYDRODIURIL) 12.5 MG tablet Take 12.5 mg by mouth daily. 01/08/19  Yes [provider]  JARDIANCE 25 MG TABS tablet Take 25 mg by mouth daily. 11/24/18  Yes [provider]  levothyroxine (SYNTHROID) 50 MCG tablet Take 50 mcg by mouth daily. 01/08/19  Yes [provider]  lisinopril (ZESTRIL) 20 MG tablet Take 20 mg by mouth daily. 12/21/18  Yes [provider]  metFORMIN (GLUCOPHAGE) 1000 MG tablet Take 1,000 mg by mouth 2 (two) times daily. 01/08/19  Yes [provider]    Physical Exam: Vitals:   02/23/19 1630 02/23/19 1907 02/23/19 1939 02/23/19 2015  BP: 114/70 137/78  (!) 141/81  Pulse: 96 (!) 110  (!) 109  Resp:  20  20  Temp:   (!) 101.1 F (38.4 C)   TempSrc:  Oral   SpO2: 100% 95%  96%    General: alert and oriented to time, place, and person. Appear in mild distress, affect appropriate Eyes: PERRL, Conjunctiva normal ENT: Oral Mucosa Clear, moist  Neck: no JVD, no Abnormal Mass Or lumps Cardiovascular: S1 and S2 Present, no Murmur, peripheral pulses symmetrical Respiratory: good respiratory effort, Bilateral Air entry equal and  Decreased, no signs of accessory muscle use, Clear to Auscultation, no Crackles, no wheezes Abdomen: Bowel Sound present, Soft and no tenderness, no hernia Skin: Left V3 shingles, currently healed.  Left ear skin tear with mild oozing. Extremities: no Pedal edema, no calf tenderness Neurologic: without any new focal findings Gait not checked due to patient safety concerns  Data Reviewed: I have personally reviewed and interpreted labs, imaging as discussed below.  CBC: Recent Labs  Lab 02/23/19 1108  WBC 21.5*  NEUTROABS 18.2*  HGB 10.5*  HCT 34.1*  MCV 94.2  PLT 405*   Basic Metabolic Panel: Recent Labs  Lab 02/23/19 1108  NA 135  K 4.3  CL 97*  CO2 22  GLUCOSE 179*  BUN 27*  CREATININE 1.31*  CALCIUM 9.4   GFR: CrCl cannot be calculated (Unknown ideal weight.). Liver Function Tests: Recent Labs  Lab 02/23/19 1108  AST 20  ALT 20  ALKPHOS 100  BILITOT 0.6  PROT 9.2*  ALBUMIN 3.8   Recent Labs  Lab 02/23/19 1108  LIPASE 17   No results for input(s): AMMONIA in the last 168 hours. Coagulation Profile: No results for input(s): INR, PROTIME in the last 168 hours. Cardiac Enzymes: No results for input(s): CKTOTAL, CKMB, CKMBINDEX, TROPONINI in the last 168 hours. BNP (last 3 results) No results for input(s): PROBNP in the last 8760 hours. HbA1C: No results for input(s): HGBA1C in the last 72 hours. CBG: No results for input(s): GLUCAP in the last 168 hours. Lipid Profile: No results for input(s): CHOL, HDL, LDLCALC, TRIG, CHOLHDL, LDLDIRECT in the last 72 hours. Thyroid Function Tests: No results for input(s): TSH, T4TOTAL, FREET4, T3FREE, THYROIDAB in the last 72 hours. Anemia Panel: No results for input(s): VITAMINB12, FOLATE, FERRITIN, TIBC, IRON, RETICCTPCT in the last 72 hours. Urine analysis:    Component Value Date/Time   COLORURINE YELLOW 02/23/2019 1108   APPEARANCEUR CLEAR 02/23/2019 1108   LABSPEC 1.021 02/23/2019 1108   PHURINE 5.0  02/23/2019 1108   GLUCOSEU >=500 (A) 02/23/2019 1108   HGBUR SMALL (A) 02/23/2019 1108   BILIRUBINUR NEGATIVE 02/23/2019 1108   KETONESUR 20 (A) 02/23/2019 1108   PROTEINUR 100 (A) 02/23/2019 1108   UROBILINOGEN 0.2 07/20/2010 2137   NITRITE NEGATIVE 02/23/2019 1108   LEUKOCYTESUR NEGATIVE 02/23/2019 1108    Radiological Exams on Admission: Dg Chest Portable 1 View  Result Date: 02/23/2019 CLINICAL DATA:  Weakness. EXAM: PORTABLE CHEST 1 VIEW COMPARISON:  July 20, 2010 FINDINGS: The heart size and mediastinal contours are within normal limits. Both lungs are clear. The visualized skeletal structures are unremarkable. IMPRESSION: No active disease. Electronically Signed   By: Gerome Samavid  Williams III M.D   On: 02/23/2019 10:36    I reviewed all nursing notes, pharmacy notes, vitals, pertinent old records.  Assessment/Plan 1.  SIRS Presents with generalized weakness. Found to have leukocytosis, fever, tachycardia. Meeting Sirs criteria but no evidence of source of infection. We will provide with IV hydration and monitor. Currently holding off on antibiotic.  2.  Poor p.o. intake, generalized weakness, recent shingles. Fall Provide pain control. Continue gabapentin. PT OT consultation. Further  work-up for generalized weakness for other etiology.  No focal deficit at the time of my evaluation to suggest any stroke or any other acute abnormality.  3.  Essential hypertension. Continue home medication other than hydrochlorothiazide.  4.  Type 2 diabetes mellitus. Check hemoglobin A1c. Currently on sliding scale insulin. Holding oral hypoglycemic agents  5.  Hypothyroidism. Continue Synthroid.  6.  Acute kidney injury. In the setting of poor p.o. intake from her recent shingles as well as ongoing use of antihypertensive diuretic. We will provide gentle IV hydration and monitor. Holding lisinopril holding HCTZ.  Nutrition: Cardiac diet DVT Prophylaxis: Subcutaneous Heparin    Advance goals of care discussion: Full code   Consults: none   Family Communication: family was present at bedside, at the time of interview.  Opportunity was given to ask question and all questions were answered satisfactorily.  Disposition: Admitted as observation, med-surge unit. Likely to be discharged home, in 1-2 days.  I have discussed plan of care as described above with RN and patient/family.  Author: Berle Mull, MD [facility] 02/23/2019 8:25 PM   To reach On-call, see care teams to locate the attending and reach out to them via [email or web address]. If 7PM-7AM, please contact night-coverage If you still have difficulty reaching the attending provider, please page the [facility] (Director on Call) for [facility] on amion for assistance.

## 2019-02-24 ENCOUNTER — Encounter (HOSPITAL_COMMUNITY): Payer: Self-pay

## 2019-02-24 DIAGNOSIS — R7881 Bacteremia: Secondary | ICD-10-CM | POA: Diagnosis not present

## 2019-02-24 DIAGNOSIS — A403 Sepsis due to Streptococcus pneumoniae: Secondary | ICD-10-CM | POA: Diagnosis present

## 2019-02-24 DIAGNOSIS — I10 Essential (primary) hypertension: Secondary | ICD-10-CM | POA: Diagnosis present

## 2019-02-24 DIAGNOSIS — D649 Anemia, unspecified: Secondary | ICD-10-CM | POA: Diagnosis present

## 2019-02-24 DIAGNOSIS — K089 Disorder of teeth and supporting structures, unspecified: Secondary | ICD-10-CM

## 2019-02-24 DIAGNOSIS — B029 Zoster without complications: Secondary | ICD-10-CM | POA: Diagnosis present

## 2019-02-24 DIAGNOSIS — N179 Acute kidney failure, unspecified: Secondary | ICD-10-CM | POA: Diagnosis present

## 2019-02-24 DIAGNOSIS — E119 Type 2 diabetes mellitus without complications: Secondary | ICD-10-CM

## 2019-02-24 DIAGNOSIS — B955 Unspecified streptococcus as the cause of diseases classified elsewhere: Secondary | ICD-10-CM

## 2019-02-24 DIAGNOSIS — G629 Polyneuropathy, unspecified: Secondary | ICD-10-CM

## 2019-02-24 DIAGNOSIS — E1121 Type 2 diabetes mellitus with diabetic nephropathy: Secondary | ICD-10-CM | POA: Diagnosis not present

## 2019-02-24 DIAGNOSIS — Z7984 Long term (current) use of oral hypoglycemic drugs: Secondary | ICD-10-CM | POA: Diagnosis not present

## 2019-02-24 DIAGNOSIS — Z79890 Hormone replacement therapy (postmenopausal): Secondary | ICD-10-CM | POA: Diagnosis not present

## 2019-02-24 DIAGNOSIS — E039 Hypothyroidism, unspecified: Secondary | ICD-10-CM | POA: Diagnosis present

## 2019-02-24 DIAGNOSIS — Z79899 Other long term (current) drug therapy: Secondary | ICD-10-CM | POA: Diagnosis not present

## 2019-02-24 DIAGNOSIS — W19XXXA Unspecified fall, initial encounter: Secondary | ICD-10-CM | POA: Diagnosis present

## 2019-02-24 DIAGNOSIS — Z23 Encounter for immunization: Secondary | ICD-10-CM | POA: Diagnosis present

## 2019-02-24 DIAGNOSIS — A419 Sepsis, unspecified organism: Secondary | ICD-10-CM | POA: Insufficient documentation

## 2019-02-24 DIAGNOSIS — B954 Other streptococcus as the cause of diseases classified elsewhere: Secondary | ICD-10-CM | POA: Diagnosis not present

## 2019-02-24 DIAGNOSIS — H9190 Unspecified hearing loss, unspecified ear: Secondary | ICD-10-CM | POA: Diagnosis present

## 2019-02-24 DIAGNOSIS — B0222 Postherpetic trigeminal neuralgia: Secondary | ICD-10-CM | POA: Diagnosis not present

## 2019-02-24 DIAGNOSIS — R531 Weakness: Secondary | ICD-10-CM | POA: Diagnosis present

## 2019-02-24 DIAGNOSIS — B028 Zoster with other complications: Secondary | ICD-10-CM | POA: Diagnosis present

## 2019-02-24 DIAGNOSIS — Z20828 Contact with and (suspected) exposure to other viral communicable diseases: Secondary | ICD-10-CM | POA: Diagnosis present

## 2019-02-24 LAB — GLUCOSE, CAPILLARY
Glucose-Capillary: 115 mg/dL — ABNORMAL HIGH (ref 70–99)
Glucose-Capillary: 135 mg/dL — ABNORMAL HIGH (ref 70–99)
Glucose-Capillary: 177 mg/dL — ABNORMAL HIGH (ref 70–99)
Glucose-Capillary: 118 mg/dL — ABNORMAL HIGH (ref 70–99)

## 2019-02-24 LAB — CBC WITH DIFFERENTIAL/PLATELET
Abs Immature Granulocytes: 0.32 10*3/uL — ABNORMAL HIGH (ref 0.00–0.07)
Basophils Absolute: 0.1 10*3/uL (ref 0.0–0.1)
Basophils Relative: 0 %
Eosinophils Absolute: 0 10*3/uL (ref 0.0–0.5)
Eosinophils Relative: 0 %
HCT: 33.3 % — ABNORMAL LOW (ref 36.0–46.0)
Hemoglobin: 10 g/dL — ABNORMAL LOW (ref 12.0–15.0)
Immature Granulocytes: 1 %
Lymphocytes Relative: 11 %
Lymphs Abs: 2.4 10*3/uL (ref 0.7–4.0)
MCH: 28.7 pg (ref 26.0–34.0)
MCHC: 30 g/dL (ref 30.0–36.0)
MCV: 95.7 fL (ref 80.0–100.0)
Monocytes Absolute: 1 10*3/uL (ref 0.1–1.0)
Monocytes Relative: 5 %
Neutro Abs: 18.4 10*3/uL — ABNORMAL HIGH (ref 1.7–7.7)
Neutrophils Relative %: 83 %
Platelets: 303 10*3/uL (ref 150–400)
RBC: 3.48 MIL/uL — ABNORMAL LOW (ref 3.87–5.11)
RDW: 13.8 % (ref 11.5–15.5)
WBC: 22.2 10*3/uL — ABNORMAL HIGH (ref 4.0–10.5)
nRBC: 0 % (ref 0.0–0.2)

## 2019-02-24 LAB — URINE CULTURE: Culture: NO GROWTH

## 2019-02-24 LAB — BLOOD CULTURE ID PANEL (REFLEXED)

## 2019-02-24 LAB — COMPREHENSIVE METABOLIC PANEL
ALT: 16 U/L (ref 0–44)
AST: 18 U/L (ref 15–41)
Albumin: 3.2 g/dL — ABNORMAL LOW (ref 3.5–5.0)
Alkaline Phosphatase: 92 U/L (ref 38–126)
Anion gap: 11 (ref 5–15)
BUN: 25 mg/dL — ABNORMAL HIGH (ref 8–23)
CO2: 22 mmol/L (ref 22–32)
Calcium: 8.5 mg/dL — ABNORMAL LOW (ref 8.9–10.3)
Chloride: 104 mmol/L (ref 98–111)
Creatinine, Ser: 1 mg/dL (ref 0.44–1.00)
GFR calc Af Amer: >60 mL/min (ref >60)
GFR calc non Af Amer: 57 mL/min — ABNORMAL LOW (ref >60)
Glucose, Bld: 140 mg/dL — ABNORMAL HIGH (ref 70–99)
Potassium: 3.7 mmol/L (ref 3.5–5.1)
Sodium: 137 mmol/L (ref 135–145)
Total Bilirubin: 0.7 mg/dL (ref 0.3–1.2)
Total Protein: 8.1 g/dL (ref 6.5–8.1)

## 2019-02-24 LAB — T4, FREE: Free T4: 1.03 ng/dL (ref 0.61–1.12)

## 2019-02-24 LAB — PROCALCITONIN: Procalcitonin: 2.21 ng/mL

## 2019-02-24 LAB — CK: Total CK: 65 U/L (ref 38–234)

## 2019-02-24 LAB — TSH: TSH: 0.752 u[IU]/mL (ref 0.350–4.500)

## 2019-02-24 MED ORDER — INFLUENZA VAC A&B SA ADJ QUAD 0.5 ML IM PRSY
0.5000 mL | PREFILLED_SYRINGE | INTRAMUSCULAR | Status: AC
  Administered 2019-02-25: 0.5 mL via INTRAMUSCULAR
  Filled 2019-02-24: qty 0.5

## 2019-02-24 MED ORDER — SODIUM CHLORIDE 0.9 % IV BOLUS
1000.0000 mL | Freq: Once | INTRAVENOUS | Status: AC
  Administered 2019-02-24: 1000 mL via INTRAVENOUS

## 2019-02-24 MED ORDER — SODIUM CHLORIDE 0.9 % IV SOLN
2.0000 g | INTRAVENOUS | Status: DC
  Administered 2019-02-24 – 2019-02-25 (×2): 2 g via INTRAVENOUS
  Filled 2019-02-24: qty 20
  Filled 2019-02-24 (×2): qty 2

## 2019-02-24 NOTE — Progress Notes (Signed)
PHARMACY - PHYSICIAN COMMUNICATION CRITICAL VALUE ALERT - BLOOD CULTURE IDENTIFICATION (BCID)  [name] is an 73 y.o. female who presented to [facility] on 02/23/2019 with a chief complaint of fever, weakness s/p fall  Assessment:  SIRS but no source of infection so decided to hold antibiotics after initial doses in ED.  She has been febrile since admission- Tm 103.59F.   Name of physician (or Provider) ContactedPosey Pronto  Current antibiotics: none.   Received Rocephin 2gm + Zithromax 500mg  in ED on 9/29 afternoon.   Changes to prescribed antibiotics recommended:  Add Rocephin 2gm IV q24h Recommendations accepted by provider  Results for orders placed or performed during the hospital encounter of 02/23/19  Blood Culture ID Panel (Reflexed) (Collected: 02/23/2019 11:08 AM)  Result Value Ref Range   Enterococcus species NOT DETECTED NOT DETECTED   Listeria monocytogenes NOT DETECTED NOT DETECTED   Staphylococcus species NOT DETECTED NOT DETECTED   Staphylococcus aureus (BCID) NOT DETECTED NOT DETECTED   Streptococcus species DETECTED (A) NOT DETECTED   Streptococcus agalactiae NOT DETECTED NOT DETECTED   Streptococcus pneumoniae NOT DETECTED NOT DETECTED   Streptococcus pyogenes NOT DETECTED NOT DETECTED   Acinetobacter baumannii NOT DETECTED NOT DETECTED   Enterobacteriaceae species NOT DETECTED NOT DETECTED   Enterobacter cloacae complex NOT DETECTED NOT DETECTED   Escherichia coli NOT DETECTED NOT DETECTED   Klebsiella oxytoca NOT DETECTED NOT DETECTED   Klebsiella pneumoniae NOT DETECTED NOT DETECTED   Proteus species NOT DETECTED NOT DETECTED   Serratia marcescens NOT DETECTED NOT DETECTED   Haemophilus influenzae NOT DETECTED NOT DETECTED   Neisseria meningitidis NOT DETECTED NOT DETECTED   Pseudomonas aeruginosa NOT DETECTED NOT DETECTED   Candida albicans NOT DETECTED NOT DETECTED   Candida glabrata NOT DETECTED NOT DETECTED   Candida krusei NOT DETECTED NOT DETECTED   Candida parapsilosis NOT DETECTED NOT DETECTED   Candida tropicalis NOT DETECTED NOT DETECTED    Netta Cedars, PharmD, BCPS 02/24/2019  10:04 AM

## 2019-02-24 NOTE — Progress Notes (Signed)
Called into room by PT and NT.  According to PT, patient did well getting up to the chair, but after sitting in the chair for a few minutes and getting up to go to the bathroom patient's legs started to give out so PT assisted patient to rest on the commode.  Patient appeared to become 'really out of it' according to NT, but was still talking, until NT and PT got patient to the bed using the recliner, and then the patient was responsive but slow to respond.  Pt's BP at this time was 80/50s. Upon entering the room patient was able to respond to me appropriately and reported she felt fine.  1 L NS Bolus started per Dr. Posey Pronto who is on floor to round on patient.  Patient is very comfortable, alert, oriented at this time.  Sister at bedside.  Will continue to monitor.

## 2019-02-24 NOTE — Consult Note (Signed)
[facility]    Date of Admission:  02/23/2019           Day 2 ceftriaxone       Reason for Consult: Streptococcal bacteremia in setting of recent shingles    Referring Provider: Dr. Berle Mull Primary Care Provider: Dr. Kelton Pillar  Assessment: She has streptococcal bacteremia superimposed on recent shingles.  The source for her bacteremia is not entirely clear but may be related to the crusted lesions in her ear and her scratching.  She has some left facial pain but does not describe any dental pain.  She does not have any clinical evidence of pneumonia, UTI or intra-abdominal infection.  I agree with continuing ceftriaxone pending final culture results.  I do not see any indication to continue airborne/contact precautions since her shingles lesions have crusted over.  Plan: 1. Continue ceftriaxone pending final culture results 2. Discontinue isolation  Principal Problem:   Streptococcal bacteremia Active Problems:   Shingles   Hypertension   Type 2 diabetes mellitus (HCC)   Neuropathy   Normocytic anemia   Poor dentition   Scheduled Meds: . brimonidine  1 drop Both Eyes BID   And  . timolol  1 drop Both Eyes BID  . enoxaparin (LOVENOX) injection  40 mg Subcutaneous Daily  . gabapentin  300 mg Oral BID  . [START ON 02/25/2019] influenza vaccine adjuvanted  0.5 mL Intramuscular Tomorrow-1000  . insulin aspart  0-15 Units Subcutaneous TID WC  . insulin aspart  0-5 Units Subcutaneous QHS  . levothyroxine  50 mcg Oral Daily  . sodium chloride flush  3 mL Intravenous Q12H   Continuous Infusions: . cefTRIAXone (ROCEPHIN)  IV 2 g (02/24/19 1316)  . lactated ringers 75 mL/hr at 02/23/19 1937   PRN Meds:.acetaminophen **OR** acetaminophen, calamine, hydrOXYzine, ondansetron **OR** ondansetron (ZOFRAN) IV, senna-docusate, traMADol  HPI: Sally Ramirez is a 73 y.o. female who developed shingles involving her left ear and face a little over  2 weeks ago.  She was seen by Dr. Laurann Montana, her PCP and started on valacyclovir.  Her rash improved but she was left with residual left facial pain pruritus and difficulty opening her mouth fully.  She has had progressive weakness.  Yesterday she had a fall and was brought to the hospital where she was noted to have fever of 103.2 degrees.  Chest x-ray showed no active disease.  Her urinalysis was normal and culture is negative.  Crusted lesions were noted around her left ear without any surrounding cellulitis or drainage.  She was placed in isolation for shingles and started on empiric antibiotic therapy.  She says she is feeling much better today.  Both admission blood cultures are growing a strep species.   Review of Systems: Review of Systems  Constitutional: Positive for fever and malaise/fatigue. Negative for chills, diaphoresis and weight loss.  HENT: Negative for congestion and sore throat.   Respiratory: Negative for cough, sputum production and shortness of breath.   Cardiovascular: Negative for chest pain.  Gastrointestinal: Negative for abdominal pain, constipation, diarrhea, nausea and vomiting.  Genitourinary: Negative for dysuria.  Musculoskeletal: Negative for back pain and joint pain.  Skin: Positive for itching and rash.  Neurological: Negative for headaches.    No past medical history on file.  Social History   Tobacco Use  . Smoking status: Not on file  Substance Use Topics  . Alcohol use: Not on file  . Drug  use: Not on file    No family history on file. Not on File  OBJECTIVE: Blood pressure (!) 81/53, pulse 90, temperature (!) 102 F (38.9 C), temperature source Oral, resp. rate 20, SpO2 98 %.  Physical Exam Constitutional:      Comments: She is alert and pleasant sitting up in bed.  HENT:     Ears:     Comments: She has some superficial crusted lesions in her left ear.    Mouth/Throat:     Comments: She has many missing teeth. Eyes:      Conjunctiva/sclera: Conjunctivae normal.  Neck:     Musculoskeletal: Neck supple.  Cardiovascular:     Rate and Rhythm: Normal rate and regular rhythm.     Heart sounds: No murmur.  Pulmonary:     Effort: Pulmonary effort is normal.     Breath sounds: Normal breath sounds.  Abdominal:     Palpations: Abdomen is soft.     Tenderness: There is no abdominal tenderness.  Musculoskeletal:        General: No swelling or tenderness.  Neurological:     Mental Status: She is oriented to person, place, and time.  Psychiatric:        Mood and Affect: Mood normal.     Lab Results Lab Results  Component Value Date   WBC 22.2 (H) 02/24/2019   HGB 10.0 (L) 02/24/2019   HCT 33.3 (L) 02/24/2019   MCV 95.7 02/24/2019   PLT 303 02/24/2019    Lab Results  Component Value Date   CREATININE 1.00 02/24/2019   BUN 25 (H) 02/24/2019   NA 137 02/24/2019   K 3.7 02/24/2019   CL 104 02/24/2019   CO2 22 02/24/2019    Lab Results  Component Value Date   ALT 16 02/24/2019   AST 18 02/24/2019   ALKPHOS 92 02/24/2019   BILITOT 0.7 02/24/2019     Microbiology: Recent Results (from the past 240 hour(s))  Urine culture     Status: None   Collection Time: 02/23/19 11:08 AM   Specimen: In/Out Cath Urine  Result Value Ref Range Status   Specimen Description   Final    IN/OUT CATH URINE Performed at [facility], [address] [place], [place] [address]    Special Requests   Final    NONE Performed at [facility], [address] [place], [place] [address]    Culture   Final    NO GROWTH Performed at [facility], [address] [place], [place] [address]    Report Status 02/24/2019 FINAL  Final  SARS Coronavirus 2 [facility](Hospital order, Performed in [facility] hospital lab) Nasopharyngeal Nasopharyngeal Swab     Status: None   Collection Time: 02/23/19 11:08 AM   Specimen: Nasopharyngeal Swab  Result Value Ref Range Status   SARS Coronavirus 2  NEGATIVE NEGATIVE Final    Comment: (NOTE) If result is NEGATIVE SARS-CoV-2 target nucleic acids are NOT DETECTED. The SARS-CoV-2 RNA is generally detectable in upper and lower  respiratory specimens during the acute phase of infection. The lowest  concentration of SARS-CoV-2 viral copies this assay can detect is 250  copies / mL. A negative result does not preclude SARS-CoV-2 infection  and should not be used as the sole basis for treatment or other  patient management decisions.  A negative result may occur with  improper specimen collection / handling, submission of specimen other  than nasopharyngeal swab, presence  of viral mutation(s) within the  areas targeted by this assay, and inadequate number of viral copies  (<250 copies / mL). A negative result must be combined with clinical  observations, patient history, and epidemiological information. If result is POSITIVE SARS-CoV-2 target nucleic acids are DETECTED. The SARS-CoV-2 RNA is generally detectable in upper and lower  respiratory specimens dur ing the acute phase of infection.  Positive  results are indicative of active infection with SARS-CoV-2.  Clinical  correlation with patient history and other diagnostic information is  necessary to determine patient infection status.  Positive results do  not rule out bacterial infection or co-infection with other viruses. If result is PRESUMPTIVE POSTIVE SARS-CoV-2 nucleic acids MAY BE PRESENT.   A presumptive positive result was obtained on the submitted specimen  and confirmed on repeat testing.  While 2019 novel coronavirus  (SARS-CoV-2) nucleic acids may be present in the submitted sample  additional confirmatory testing may be necessary for epidemiological  and / or clinical management purposes  to differentiate between  SARS-CoV-2 and other Sarbecovirus currently known to infect humans.  If clinically indicated additional testing with an alternate test  methodology [phone])  is advised. The SARS-CoV-2 RNA is generally  detectable in upper and lower respiratory sp ecimens during the acute  phase of infection. The expected result is Negative. Fact Sheet for Patients:  BoilerBrush.com.cy Fact Sheet for Healthcare Providers: https://pope.com/ This test is not yet approved or cleared by the [place] FDA and has been authorized for detection and/or diagnosis of SARS-CoV-2 by FDA under an Emergency Use Authorization (EUA).  This EUA will remain in effect (meaning this test can be used) for the duration of the COVID-19 declaration under Section 564(b)(1) of the Act, 21 U.S.C. section 360bbb-3(b)(1), unless the authorization is terminated or revoked sooner. Performed at [facility], [address], [place], [place] [address]   Blood culture (routine x 2)     Status: None (Preliminary result)   Collection Time: 02/23/19 11:08 AM   Specimen: BLOOD  Result Value Ref Range Status   Specimen Description   Final    BLOOD Performed at [facility], [address] [place], [place] [address]    Special Requests   Final    BOTTLES DRAWN AEROBIC AND ANAEROBIC Blood Culture results may not be optimal due to an inadequate volume of blood received in culture bottles Performed at [facility], [address] [place], [place] [address]    Culture  Setup Time   Final    GRAM POSITIVE COCCI ANAEROBIC BOTTLE ONLY CRITICAL VALUE NOTED.  VALUE IS CONSISTENT WITH PREVIOUSLY REPORTED AND CALLED VALUE.    Culture   Final    NO GROWTH < 24 HOURS Performed at [facility], [address] [place], [place] [address]    Report Status PENDING  Incomplete  Blood culture (routine x 2)     Status: None (Preliminary result)   Collection Time: 02/23/19 11:08 AM   Specimen: BLOOD  Result Value Ref Range Status   Specimen Description   Final    BLOOD Performed at [facility], [address] [place], [place] [address]    Special Requests   Final    BOTTLES DRAWN AEROBIC AND ANAEROBIC Blood Culture adequate volume Performed at [facility], [address], [place], [place] [address]    Culture  Setup Time   Final    GRAM POSITIVE COCCI IN  BOTH AEROBIC AND ANAEROBIC BOTTLES Organism ID to follow CRITICAL RESULT CALLED TO, READ BACK BY AND VERIFIED WITH: Jodi Marble PharmD 9:25 02/24/19 ([email or web address]) Performed at [facility], [address] [place], [place] [address]    Culture GRAM POSITIVE COCCI  Final   Report Status PENDING  Incomplete  Blood Culture ID Panel (Reflexed)     Status: Abnormal   Collection Time: 02/23/19 11:08 AM  Result Value Ref Range Status   Enterococcus species NOT DETECTED NOT DETECTED Final   Listeria monocytogenes NOT DETECTED NOT DETECTED Final   Staphylococcus species NOT DETECTED NOT DETECTED Final   Staphylococcus aureus (BCID) NOT DETECTED NOT DETECTED Final   Streptococcus species DETECTED (A) NOT DETECTED Final    Comment: Not Enterococcus species, Streptococcus agalactiae, Streptococcus pyogenes, or Streptococcus pneumoniae. CRITICAL RESULT CALLED TO, READ BACK BY AND VERIFIED WITH: Jodi Marble PharmD 9:25 02/24/19 ([email or web address])    Streptococcus agalactiae NOT DETECTED NOT DETECTED Final   Streptococcus pneumoniae NOT DETECTED NOT DETECTED Final   Streptococcus pyogenes NOT DETECTED NOT DETECTED Final   Acinetobacter baumannii NOT DETECTED NOT DETECTED Final   Enterobacteriaceae species NOT DETECTED NOT DETECTED Final   Enterobacter cloacae complex NOT DETECTED NOT DETECTED Final   Escherichia coli NOT DETECTED NOT DETECTED Final   Klebsiella oxytoca NOT DETECTED NOT DETECTED Final   Klebsiella pneumoniae NOT DETECTED NOT DETECTED Final   Proteus species NOT DETECTED NOT DETECTED Final   Serratia marcescens NOT DETECTED NOT DETECTED Final   Haemophilus influenzae NOT  DETECTED NOT DETECTED Final   Neisseria meningitidis NOT DETECTED NOT DETECTED Final   Pseudomonas aeruginosa NOT DETECTED NOT DETECTED Final   Candida albicans NOT DETECTED NOT DETECTED Final   Candida glabrata NOT DETECTED NOT DETECTED Final   Candida krusei NOT DETECTED NOT DETECTED Final   Candida parapsilosis NOT DETECTED NOT DETECTED Final   Candida tropicalis NOT DETECTED NOT DETECTED Final    Comment: Performed at [facility], [address], [place], [place] [address]    Cliffton Asters, MD [facility] for Infectious Disease [facility] [phone] pager   [phone] cell 02/24/2019, 3:52 PM

## 2019-02-24 NOTE — Progress Notes (Addendum)
[facility] Progress Note  Patient: Sally Ramirez WUJ:[id number]   PCP: Patient, No Pcp Per DOB: [birth year]   DOA: 02/23/2019   DOS: 02/24/2019   Date of Service: the patient was seen and examined on 02/24/2019  Chief Complaint  Patient presents with  . Weakness    Brief hospital course: Pt. with PMH of  HTN, neuropathy, type II DM, hypothyroidism recent shingles; presented with complain of fall and generalized weakness, was found to have streptococcal bacteremia.  Currently further plan is continue IV antibiotics.  Subjective: Feeling better, continues to have itching but improving, continues to have pain in her left ear.  No nausea no vomiting.  No fever no chills.  No diarrhea. Reported dizziness while working with physical therapy but currently no further focal deficit or dizziness.  Assessment and Plan: 1.    Sepsis secondary to streptococcal pneumoniae bacteremia. Source possibly ear infection. Presents with generalized weakness. Found to have leukocytosis, fever, tachycardia. Meeting Sirs criteria but no evidence of source of infection. We will provide with IV hydration and monitor. Blood cultures came back positive for strep pneumoniae. ID consulted given lack of source. Appreciate their assistance. Continue IV ceftriaxone.   2.  Poor p.o. intake, generalized weakness, recent shingles. Fall Provide pain control. Continue gabapentin. PT OT consultation. Further work-up for generalized weakness for other etiology.  No focal deficit at the time of my evaluation to suggest any stroke or any other acute abnormality.  3.  Essential hypertension. Continue home medication other than hydrochlorothiazide.  4.  Type 2 diabetes mellitus. Check hemoglobin A1c. Currently on sliding scale insulin. Holding oral hypoglycemic agents  5.  Hypothyroidism. Continue Synthroid.  6.  Acute kidney injury. In the setting of poor p.o. intake from her recent shingles as well as  ongoing use of antihypertensive diuretic. We will provide gentle IV hydration and monitor. Holding lisinopril holding HCTZ.  7.  Recent shingles. Patient had 2 weeks of history of shingles treated with oral Valtrex outpatient. Most of the lesions are completely healed. 2 lesions on the ER does show some ulceration although this is from patient's itching rather than active vesicular lesions. I do not think the patient requires airborne isolation and ID agrees with it. Appreciate their assistance. Continue pain control.  Diet: Regular diet  DVT Prophylaxis: Subcutaneous Heparin   Advance goals of care discussion: Full code  Family Communication: family was present at bedside, at the time of interview. The pt provided permission to discuss medical plan with the family. Opportunity was given to ask question and all questions were answered satisfactorily.   Disposition:  Discharge to home.  Consultants: Infectious disease Procedures: None  Scheduled Meds: . brimonidine  1 drop Both Eyes BID   And  . timolol  1 drop Both Eyes BID  . enoxaparin (LOVENOX) injection  40 mg Subcutaneous Daily  . gabapentin  300 mg Oral BID  . [START ON 02/25/2019] influenza vaccine adjuvanted  0.5 mL Intramuscular Tomorrow-1000  . insulin aspart  0-15 Units Subcutaneous TID WC  . insulin aspart  0-5 Units Subcutaneous QHS  . levothyroxine  50 mcg Oral Daily  . sodium chloride flush  3 mL Intravenous Q12H   Continuous Infusions: . cefTRIAXone (ROCEPHIN)  IV 2 g (02/24/19 1316)  . lactated ringers 75 mL/hr at 02/23/19 1937   PRN Meds: acetaminophen **OR** acetaminophen, calamine, hydrOXYzine, ondansetron **OR** ondansetron (ZOFRAN) IV, senna-docusate, traMADol Antibiotics: Anti-infectives (From admission, onward)   Start     Dose/Rate Route Frequency Ordered  Stop   02/24/19 1300  cefTRIAXone (ROCEPHIN) 2 g in sodium chloride 0.9 % 100 mL IVPB     2 g 200 mL/hr over 30 Minutes Intravenous Every 24  hours 02/24/19 1009     02/23/19 1230  azithromycin (ZITHROMAX) 500 mg in sodium chloride 0.9 % 250 mL IVPB     500 mg 250 mL/hr over 60 Minutes Intravenous  Once 02/23/19 1223 02/23/19 1631   02/23/19 1230  cefTRIAXone (ROCEPHIN) 2 g in sodium chloride 0.9 % 100 mL IVPB     2 g 200 mL/hr over 30 Minutes Intravenous  Once 02/23/19 1223 02/23/19 1332       Objective: Physical Exam: Vitals:   02/24/19 1100 02/24/19 1106 02/24/19 1314 02/24/19 1638  BP: (!) 80/51 (!) 81/53 (!) 153/90 132/77  Pulse: 93 90  (!) 101  Resp:      Temp:  100 F (37.8 C) (!) 102 F (38.9 C) 100.3 F (37.9 C)  TempSrc:   Oral Oral  SpO2:  98%     No intake or output data in the 24 hours ending 02/24/19 1739 There were no vitals filed for this visit. General: alert and oriented to time, place, and person. Appear in mild distress, affect appropriate Eyes: PERRL, Conjunctiva normal ENT: Oral Mucosa Clear, moist  Neck: no JVD, no Abnormal Mass Or lumps Cardiovascular: S1 and S2 Present, no Murmur, peripheral pulses symmetrical Respiratory: good respiratory effort, Bilateral Air entry equal and Decreased, no signs of accessory muscle use, Clear to Auscultation, no Crackles, no wheezes Abdomen: Bowel Sound present, Soft and no tenderness, no hernia Skin: no rashes, healed lesions off Extremities: no Pedal edema, no calf tenderness Neurologic: without any new focal findings Gait not checked due to patient safety concerns     Data Reviewed: I have personally reviewed and interpreted daily labs, tele strips, imagings as discussed above. I reviewed all nursing notes, pharmacy notes, vitals, pertinent old records I have discussed plan of care as described above with RN and patient/family.  CBC: Recent Labs  Lab 02/23/19 1108 02/24/19 0438  WBC 21.5* 22.2*  NEUTROABS 18.2* 18.4*  HGB 10.5* 10.0*  HCT 34.1* 33.3*  MCV 94.2 95.7  PLT 405* 303   Basic Metabolic Panel: Recent Labs  Lab 02/23/19 1108  02/24/19 0438  NA 135 137  K 4.3 3.7  CL 97* 104  CO2 22 22  GLUCOSE 179* 140*  BUN 27* 25*  CREATININE 1.31* 1.00  CALCIUM 9.4 8.5*    Liver Function Tests: Recent Labs  Lab 02/23/19 1108 02/24/19 0438  AST 20 18  ALT 20 16  ALKPHOS 100 92  BILITOT 0.6 0.7  PROT 9.2* 8.1  ALBUMIN 3.8 3.2*   Recent Labs  Lab 02/23/19 1108  LIPASE 17   No results for input(s): AMMONIA in the last 168 hours. Coagulation Profile: No results for input(s): INR, PROTIME in the last 168 hours. Cardiac Enzymes: Recent Labs  Lab 02/24/19 0438  CKTOTAL 65   BNP (last 3 results) No results for input(s): PROBNP in the last 8760 hours. CBG: Recent Labs  Lab 02/23/19 2346 02/24/19 0845 02/24/19 1210 02/24/19 1639  GLUCAP 187* 115* 135* 177*   Studies: No results found.   Time spent: 35 minutes  Author: Lynden Oxford, MD [facility] 02/24/2019 5:39 PM  To reach On-call, see care teams to locate the attending and reach out to them via [email or web address]. If 7PM-7AM, please contact night-coverage If you still have difficulty reaching the attending  provider, please page the [facility] (Director on Call) for [facility] on amion for assistance.

## 2019-02-24 NOTE — Evaluation (Signed)
Physical Therapy Evaluation Patient Details Name: Sally Ramirez MRN: [id number] DOB: 10-20-1945 Today's Date: 02/24/2019   History of Present Illness  73 y.o. female with Past medical history of HTN, neuropathy, type II DM, hypothyroidism recent shingles and admitted for SIRS and generalized weakness  Clinical Impression  Pt admitted with above diagnosis.  Pt currently with functional limitations due to the deficits listed below (see PT Problem List). Pt will benefit from skilled PT to increase their independence and safety with mobility to allow discharge to the venue listed below.  Pt pleasant and agreeable to mobilize. Pt reported feeling better this morning.  Pt assisted into bathroom however had decreased verbal responses (however awake entire time) and LEs giving out so assisted safely to sitting on toilet and called for further assist.  Nurse tech into room and assisted with stand pivot transfer of pt to recliner.  BPs obtained (per nurse tech in docflowsheets) and remained low while pt reclined in recliner so RN called and pt assisted back to bed via supine lateral maneuver with linen.  RN into room end of session.     Follow Up Recommendations SNF;Supervision/Assistance - 24 hour    Equipment Recommendations  Rolling walker with 5" wheels    Recommendations for Other Services       Precautions / Restrictions Precautions Precautions: Fall      Mobility  Bed Mobility Overal bed mobility: Needs Assistance Bed Mobility: Supine to Sit     Supine to sit: Supervision;HOB elevated     General bed mobility comments: pt enjoying breakfast on arrival, pt able to sit edge of bed few a minutes while sipping coffee  Transfers Overall transfer level: Needs assistance Equipment used: None Transfers: Sit to/from Stand Sit to Stand: Mod assist;Min guard         General transfer comment: min/guard for safety, however pt required assist to safely descend to sitting due to decreased  responsiveness with verbalization and legs giving out  Ambulation/Gait Ambulation/Gait assistance: Min assist Gait Distance (Feet): 8 Feet Assistive device: IV Pole Gait Pattern/deviations: Step-through pattern;Decreased stride length     General Gait Details: pt pushed IV pole into bathroom and required more assist as nearing toilet however had decreased verbalizations and difficulty verbalizing needs as well as legs giving out so assisted to toilet and pulled cord for staff to further assist  Stairs            Wheelchair Mobility    Modified Rankin (Stroke Patients Only)       Balance                                             Pertinent Vitals/Pain Pain Assessment: No/denies pain    Home Living Family/patient expects to be discharged to:: Private residence Living Arrangements: Spouse/significant other   Type of Home: [place]: None      Prior Function Level of Independence: Independent               Hand Dominance        Extremity/Trunk Assessment   Upper Extremity Assessment Upper Extremity Assessment: Generalized weakness    Lower Extremity Assessment Lower Extremity Assessment: Generalized weakness       Communication   Communication: No difficulties  Cognition Arousal/Alertness: Awake/alert Behavior During Therapy: WFL for tasks assessed/performed  Overall Cognitive Status: Within Functional Limits for tasks assessed                                        General Comments      Exercises     Assessment/Plan    PT Assessment Patient needs continued PT services  PT Problem List Decreased strength;Decreased mobility;Decreased balance;Decreased activity tolerance;Decreased knowledge of use of DME       PT Treatment Interventions DME instruction;Therapeutic exercise;Gait training;Balance training;Stair training;Neuromuscular re-education;Functional  mobility training;Therapeutic activities;Patient/family education    PT Goals (Current goals can be found in the Care Plan section)  Acute Rehab PT Goals PT Goal Formulation: With patient Time For Goal Achievement: 03/10/19 Potential to Achieve Goals: Good    Frequency Min 2X/week   Barriers to discharge        Co-evaluation               AM-PAC PT "6 Clicks" Mobility  Outcome Measure Help needed turning from your back to your side while in a flat bed without using bedrails?: A Little Help needed moving from lying on your back to sitting on the side of a flat bed without using bedrails?: A Lot Help needed moving to and from a bed to a chair (including a wheelchair)?: A Lot Help needed standing up from a chair using your arms (e.g., wheelchair or bedside chair)?: A Lot Help needed to walk in hospital room?: A Lot Help needed climbing 3-5 steps with a railing? : Total 6 Click Score: 12    End of Session   Activity Tolerance: Patient limited by fatigue Patient left: in bed;with call bell/phone within reach;with nursing/sitter in room Nurse Communication: Mobility status PT Visit Diagnosis: Other abnormalities of gait and mobility (R26.89);Muscle weakness (generalized) (M62.81);Unsteadiness on feet (R26.81)    Time: 1018-1100 PT Time Calculation (min) (ACUTE ONLY): 42 min   Charges:   PT Evaluation $PT Eval Low Complexity: 1 Low PT Treatments $Therapeutic Activity: 8-22 mins       Zenovia Jarred, PT, DPT Acute Rehabilitation Services Office: [phone] Pager: [phone]  Sarajane Jews 02/24/2019, 4:10 PM

## 2019-02-25 ENCOUNTER — Encounter (HOSPITAL_COMMUNITY): Payer: Self-pay

## 2019-02-25 DIAGNOSIS — B029 Zoster without complications: Secondary | ICD-10-CM

## 2019-02-25 DIAGNOSIS — B954 Other streptococcus as the cause of diseases classified elsewhere: Secondary | ICD-10-CM

## 2019-02-25 LAB — GLUCOSE, CAPILLARY
Glucose-Capillary: 82 mg/dL (ref 70–99)
Glucose-Capillary: 165 mg/dL — ABNORMAL HIGH (ref 70–99)
Glucose-Capillary: 168 mg/dL — ABNORMAL HIGH (ref 70–99)
Glucose-Capillary: 150 mg/dL — ABNORMAL HIGH (ref 70–99)

## 2019-02-25 LAB — CBC
HCT: 29.3 % — ABNORMAL LOW (ref 36.0–46.0)
Hemoglobin: 8.8 g/dL — ABNORMAL LOW (ref 12.0–15.0)
MCH: 28.9 pg (ref 26.0–34.0)
MCHC: 30 g/dL (ref 30.0–36.0)
MCV: 96.1 fL (ref 80.0–100.0)
Platelets: 194 10*3/uL (ref 150–400)
RBC: 3.05 MIL/uL — ABNORMAL LOW (ref 3.87–5.11)
RDW: 14 % (ref 11.5–15.5)
WBC: 18.7 10*3/uL — ABNORMAL HIGH (ref 4.0–10.5)
nRBC: 0 % (ref 0.0–0.2)

## 2019-02-25 LAB — BASIC METABOLIC PANEL
Anion gap: 8 (ref 5–15)
BUN: 23 mg/dL (ref 8–23)
CO2: 21 mmol/L — ABNORMAL LOW (ref 22–32)
Calcium: 8.4 mg/dL — ABNORMAL LOW (ref 8.9–10.3)
Chloride: 106 mmol/L (ref 98–111)
Creatinine, Ser: 0.94 mg/dL (ref 0.44–1.00)
GFR calc Af Amer: >60 mL/min (ref >60)
GFR calc non Af Amer: >60 mL/min (ref >60)
Glucose, Bld: 103 mg/dL — ABNORMAL HIGH (ref 70–99)
Potassium: 4.1 mmol/L (ref 3.5–5.1)
Sodium: 135 mmol/L (ref 135–145)

## 2019-02-25 NOTE — Progress Notes (Signed)
[facility] Progress Note  Patient: Sally Ramirez DZH:[id number]   PCP: Patient, No Pcp Per DOB: February 20, 1946   DOA: 02/23/2019   DOS: 02/25/2019   Date of Service: the patient was seen and examined on 02/25/2019  Chief Complaint  Patient presents with  . Weakness    Brief hospital course: Pt. with PMH of  HTN, neuropathy, type II DM, hypothyroidism recent shingles; presented with complain of fall and generalized weakness, was found to have streptococcal bacteremia.  Currently further plan is continue IV antibiotics.  Subjective: No acute complaint no nausea no vomiting no fever no chills no chest pain.  Itching still present on the left ear but improving.  Assessment and Plan: 1.    Sepsis secondary to streptococcal pneumoniae bacteremia. Sepsis ruled in Source possibly ear infection. Presents with generalized weakness. Found to have leukocytosis, fever, tachycardia. Blood cultures came back positive for strep pneumoniae. ID consulted given lack of source. Appreciate their assistance. Continue IV ceftriaxone.   2.  Poor p.o. intake, generalized weakness, recent shingles. Fall Provide pain control. Continue gabapentin. PT OT consultation. Further work-up for generalized weakness for other etiology.  No focal deficit at the time of my evaluation to suggest any stroke or any other acute abnormality.  3.  Essential hypertension. Continue home medication other than hydrochlorothiazide.  4.  Type 2 diabetes mellitus. Check hemoglobin A1c. Currently on sliding scale insulin. Holding oral hypoglycemic agents  5.  Hypothyroidism. Continue Synthroid.  6.  Acute kidney injury. In the setting of poor p.o. intake from her recent shingles as well as ongoing use of antihypertensive diuretic. We will provide gentle IV hydration and monitor. Holding lisinopril holding HCTZ.  7.  Recent shingles. Patient had 2 weeks of history of shingles treated with oral Valtrex  outpatient. Most of the lesions are completely healed. 2 lesions on the ER does show some ulceration although this is from patient's itching rather than active vesicular lesions. I do not think the patient requires airborne isolation and ID agrees with it. Appreciate their assistance. Continue pain control.  Diet: Regular diet  DVT Prophylaxis: Subcutaneous Heparin   Advance goals of care discussion: Full code  Family Communication: family was present at bedside, at the time of interview. The pt provided permission to discuss medical plan with the family. Opportunity was given to ask question and all questions were answered satisfactorily.   Disposition:  Discharge to home.  Consultants: Infectious disease Procedures: None  Scheduled Meds: . brimonidine  1 drop Both Eyes BID   And  . timolol  1 drop Both Eyes BID  . enoxaparin (LOVENOX) injection  40 mg Subcutaneous Daily  . gabapentin  300 mg Oral BID  . insulin aspart  0-15 Units Subcutaneous TID WC  . insulin aspart  0-5 Units Subcutaneous QHS  . levothyroxine  50 mcg Oral Daily  . sodium chloride flush  3 mL Intravenous Q12H   Continuous Infusions: . cefTRIAXone (ROCEPHIN)  IV 2 g (02/25/19 1456)  . lactated ringers 75 mL/hr at 02/23/19 1937   PRN Meds: acetaminophen **OR** acetaminophen, calamine, hydrOXYzine, ondansetron **OR** ondansetron (ZOFRAN) IV, senna-docusate, traMADol Antibiotics: Anti-infectives (From admission, onward)   Start     Dose/Rate Route Frequency Ordered Stop   02/24/19 1300  cefTRIAXone (ROCEPHIN) 2 g in sodium chloride 0.9 % 100 mL IVPB     2 g 200 mL/hr over 30 Minutes Intravenous Every 24 hours 02/24/19 1009     02/23/19 1230  azithromycin (ZITHROMAX) 500 mg in sodium  chloride 0.9 % 250 mL IVPB     500 mg 250 mL/hr over 60 Minutes Intravenous  Once 02/23/19 1223 02/23/19 1631   02/23/19 1230  cefTRIAXone (ROCEPHIN) 2 g in sodium chloride 0.9 % 100 mL IVPB     2 g 200 mL/hr over 30 Minutes  Intravenous  Once 02/23/19 1223 02/23/19 1332       Objective: Physical Exam: Vitals:   02/24/19 1845 02/24/19 2054 02/25/19 0647 02/25/19 1447  BP:  101/63 102/69 109/70  Pulse:  85 77 89  Resp:  16 16 16   Temp: (!) 101.6 F (38.7 C) 99.5 F (37.5 C) 100 F (37.8 C) (!) 102.9 F (39.4 C)  TempSrc:  Oral Oral Oral  SpO2:  97% 100% 98%   No intake or output data in the 24 hours ending 02/25/19 2036 There were no vitals filed for this visit. General: alert and oriented to time, place, and person. Appear in mild distress, affect appropriate Eyes: PERRL, Conjunctiva normal ENT: Oral Mucosa Clear, moist  Neck: no JVD, no Abnormal Mass Or lumps Cardiovascular: S1 and S2 Present, no Murmur, peripheral pulses symmetrical Respiratory: good respiratory effort, Bilateral Air entry equal and Decreased, no signs of accessory muscle use, Clear to Auscultation, no Crackles, no wheezes Abdomen: Bowel Sound present, Soft and no tenderness, no hernia Skin: no rashes, healed lesions off Extremities: no Pedal edema, no calf tenderness Neurologic: without any new focal findings Gait not checked due to patient safety concerns  Data Reviewed: I have personally reviewed and interpreted daily labs, tele strips, imagings as discussed above. I reviewed all nursing notes, pharmacy notes, vitals, pertinent old records I have discussed plan of care as described above with RN and patient/family.  CBC: Recent Labs  Lab 02/23/19 1108 02/24/19 0438 02/25/19 0413  WBC 21.5* 22.2* 18.7*  NEUTROABS 18.2* 18.4*  --   HGB 10.5* 10.0* 8.8*  HCT 34.1* 33.3* 29.3*  MCV 94.2 95.7 96.1  PLT 405* 303 440   Basic Metabolic Panel: Recent Labs  Lab 02/23/19 1108 02/24/19 0438 02/25/19 0413  NA 135 137 135  K 4.3 3.7 4.1  CL 97* 104 106  CO2 22 22 21*  GLUCOSE 179* 140* 103*  BUN 27* 25* 23  CREATININE 1.31* 1.00 0.94  CALCIUM 9.4 8.5* 8.4*    Liver Function Tests: Recent Labs  Lab 02/23/19 1108  02/24/19 0438  AST 20 18  ALT 20 16  ALKPHOS 100 92  BILITOT 0.6 0.7  PROT 9.2* 8.1  ALBUMIN 3.8 3.2*   Recent Labs  Lab 02/23/19 1108  LIPASE 17   No results for input(s): AMMONIA in the last 168 hours. Coagulation Profile: No results for input(s): INR, PROTIME in the last 168 hours. Cardiac Enzymes: Recent Labs  Lab 02/24/19 0438  CKTOTAL 65   BNP (last 3 results) No results for input(s): PROBNP in the last 8760 hours. CBG: Recent Labs  Lab 02/24/19 1639 02/24/19 2228 02/25/19 0752 02/25/19 1211 02/25/19 1621  GLUCAP 177* 118* 82 165* 168*   Studies: No results found.   Time spent: 35 minutes  Author: Berle Mull, MD [facility] 02/25/2019 8:36 PM  To reach On-call, see care teams to locate the attending and reach out to them via [email or web address]. If 7PM-7AM, please contact night-coverage If you still have difficulty reaching the attending provider, please page the [facility] (Director on Call) for [facility] on amion for assistance.

## 2019-02-25 NOTE — Progress Notes (Signed)
[facility]   Reason for visit: Follow up on bacteremia  Interval History: now with Strep anginosis on culture, sensitivities pending, Tmax 103 but WBC down to 18.7;  Patient feels well and is pleased with progress.     Physical Exam: Constitutional:  Vitals:   02/25/19 0647 02/25/19 1447  BP: 102/69 109/70  Pulse: 77 89  Resp: 16 16  Temp: 100 F (37.8 C) (!) 102.9 F (39.4 C)  SpO2: 100% 98%   patient appears in NAD Eyes: anicteric Respiratory: Normal respiratory effort; CTA B Cardiovascular: RRR GI: soft, nt, nd  Review of Systems: Constitutional: negative for fevers and chills Gastrointestinal: negative for nausea and diarrhea Integument/breast: negative for new rash  Lab Results  Component Value Date   WBC 18.7 (H) 02/25/2019   HGB 8.8 (L) 02/25/2019   HCT 29.3 (L) 02/25/2019   MCV 96.1 02/25/2019   PLT 194 02/25/2019    Lab Results  Component Value Date   CREATININE 0.94 02/25/2019   BUN 23 02/25/2019   NA 135 02/25/2019   K 4.1 02/25/2019   CL 106 02/25/2019   CO2 21 (L) 02/25/2019    Lab Results  Component Value Date   ALT 16 02/24/2019   AST 18 02/24/2019   ALKPHOS 92 02/24/2019     Microbiology: Recent Results (from the past 240 hour(s))  Urine culture     Status: None   Collection Time: 02/23/19 11:08 AM   Specimen: In/Out Cath Urine  Result Value Ref Range Status   Specimen Description   Final    IN/OUT CATH URINE Performed at [facility] [address], [place], [place] [address]    Special Requests   Final    NONE Performed at [facility], [address], [place], [place] [address]    Culture   Final    NO GROWTH Performed at [facility], [address] [place], [place] [address]    Report Status 02/24/2019 FINAL  Final  SARS Coronavirus 2 [facility] order, Performed in [facility] hospital lab) Nasopharyngeal Nasopharyngeal Swab     Status: None   Collection Time: 02/23/19 11:08 AM   Specimen: Nasopharyngeal Swab  Result Value Ref Range Status   SARS Coronavirus 2 NEGATIVE NEGATIVE Final    Comment: (NOTE) If result is NEGATIVE SARS-CoV-2 target nucleic acids are NOT DETECTED. The SARS-CoV-2 RNA is generally detectable in upper and lower  respiratory specimens during the acute phase of infection. The lowest  concentration of SARS-CoV-2 viral copies this assay can detect is 250  copies / mL. A negative result does not preclude SARS-CoV-2 infection  and should not be used as the sole basis for treatment or other  patient management decisions.  A negative result may occur with  improper specimen collection / handling, submission of specimen other  than nasopharyngeal swab, presence of viral mutation(s) within the  areas targeted by this assay, and inadequate number of viral copies  (<250 copies / mL). A negative result must be combined with clinical  observations, patient history, and epidemiological information. If result is POSITIVE SARS-CoV-2 target nucleic acids are DETECTED. The SARS-CoV-2 RNA is generally detectable in upper and lower  respiratory specimens dur ing the acute phase of infection.  Positive  results are indicative of active infection with SARS-CoV-2.  Clinical  correlation with patient history and other diagnostic information is  necessary to determine patient infection status.  Positive results do  not rule out bacterial infection or co-infection with other viruses. If result is PRESUMPTIVE POSTIVE SARS-CoV-2 nucleic acids MAY BE PRESENT.   A presumptive positive result was obtained on the submitted specimen  and confirmed on repeat testing.  While 2019 novel coronavirus  (SARS-CoV-2) nucleic acids may be present in the submitted sample  additional confirmatory testing may be necessary for epidemiological  and / or clinical management purposes  to differentiate between  SARS-CoV-2 and other Sarbecovirus  currently known to infect humans.  If clinically indicated additional testing with an alternate test  methodology [phone] is advised. The SARS-CoV-2 RNA is generally  detectable in upper and lower respiratory sp ecimens during the acute  phase of infection. The expected result is Negative. Fact Sheet for Patients:  BoilerBrush.com.cyhttps://www.fda.gov/media/136312/download Fact Sheet for Healthcare Providers: https://pope.com/https://www.fda.gov/media/136313/download This test is not yet approved or cleared by the [place] FDA and has been authorized for detection and/or diagnosis of SARS-CoV-2 by FDA under an Emergency Use Authorization (EUA).  This EUA will remain in effect (meaning this test can be used) for the duration of the COVID-19 declaration under Section 564(b)(1) of the Act, 21 U.S.C. section 360bbb-3(b)(1), unless the authorization is terminated or revoked sooner. Performed at [facility], [address] [place], [place] [address]   Blood culture (routine x 2)     Status: Abnormal (Preliminary result)   Collection Time: 02/23/19 11:08 AM   Specimen: BLOOD  Result Value Ref Range Status   Specimen Description   Final    BLOOD Performed at [facility], [address] [place], [place] [address]    Special Requests   Final    BOTTLES DRAWN AEROBIC AND ANAEROBIC Blood Culture results may not be optimal due to an inadequate volume of blood received in culture bottles Performed at [facility], [address] [place], [place] [address]    Culture  Setup Time   Final    GRAM POSITIVE COCCI IN BOTH AEROBIC AND ANAEROBIC BOTTLES CRITICAL VALUE NOTED.  VALUE IS CONSISTENT WITH PREVIOUSLY REPORTED AND CALLED VALUE.    Culture (A)  Final    STREPTOCOCCUS ANGINOSIS SUSCEPTIBILITIES TO FOLLOW Performed at [facility], [address] [place], [place] [address]    Report Status PENDING  Incomplete  Blood culture (routine x 2)      Status: Abnormal (Preliminary result)   Collection Time: 02/23/19 11:08 AM   Specimen: BLOOD  Result Value Ref Range Status   Specimen Description   Final    BLOOD Performed at [facility], [address] [place], [place] [address]    Special Requests   Final    BOTTLES DRAWN AEROBIC AND ANAEROBIC Blood Culture adequate volume Performed at [facility], [address] [place], [place] [address]    Culture  Setup Time   Final    GRAM POSITIVE COCCI IN BOTH AEROBIC AND ANAEROBIC BOTTLES CRITICAL RESULT CALLED TO, READ BACK BY AND VERIFIED WITH: Jodi MarbleL. Poindexter PharmD 9:25 02/24/19 ([email or web address])    Culture (A)  Final    STREPTOCOCCUS ANGINOSIS SUSCEPTIBILITIES TO FOLLOW Performed at [facility], [address] [place], [place] [address]    Report Status PENDING  Incomplete  Blood Culture ID Panel (Reflexed)     Status: Abnormal   Collection Time: 02/23/19 11:08 AM  Result Value Ref Range Status   Enterococcus species NOT DETECTED NOT DETECTED Final   Listeria monocytogenes NOT DETECTED NOT DETECTED  Final   Staphylococcus species NOT DETECTED NOT DETECTED Final   Staphylococcus aureus (BCID) NOT DETECTED NOT DETECTED Final   Streptococcus species DETECTED (A) NOT DETECTED Final    Comment: Not Enterococcus species, Streptococcus agalactiae, Streptococcus pyogenes, or Streptococcus pneumoniae. CRITICAL RESULT CALLED TO, READ BACK BY AND VERIFIED WITH: Elenore Paddy PharmD 9:25 02/24/19 ([email or web address])    Streptococcus agalactiae NOT DETECTED NOT DETECTED Final   Streptococcus pneumoniae NOT DETECTED NOT DETECTED Final   Streptococcus pyogenes NOT DETECTED NOT DETECTED Final   Acinetobacter baumannii NOT DETECTED NOT DETECTED Final   Enterobacteriaceae species NOT DETECTED NOT DETECTED Final   Enterobacter cloacae complex NOT DETECTED NOT DETECTED Final   Escherichia coli NOT DETECTED NOT DETECTED Final   Klebsiella oxytoca NOT DETECTED NOT  DETECTED Final   Klebsiella pneumoniae NOT DETECTED NOT DETECTED Final   Proteus species NOT DETECTED NOT DETECTED Final   Serratia marcescens NOT DETECTED NOT DETECTED Final   Haemophilus influenzae NOT DETECTED NOT DETECTED Final   Neisseria meningitidis NOT DETECTED NOT DETECTED Final   Pseudomonas aeruginosa NOT DETECTED NOT DETECTED Final   Candida albicans NOT DETECTED NOT DETECTED Final   Candida glabrata NOT DETECTED NOT DETECTED Final   Candida krusei NOT DETECTED NOT DETECTED Final   Candida parapsilosis NOT DETECTED NOT DETECTED Final   Candida tropicalis NOT DETECTED NOT DETECTED Final    Comment: Performed at [facility], [place], [place], [place] [address]    Impression/Plan:  1. Bacteremia - Strep anginosis.  Can be invasive but no symptoms of source other than skin.  Will keep her on ceftriaxone and narrow once sensitivities known.    2.  Shingles - crusted over and off of isolation.

## 2019-02-25 NOTE — Progress Notes (Signed)
MEWS turned yellow due to fever - not an acute change, temps have been varied.  PRN tylenol given - will continue to monitor

## 2019-02-26 ENCOUNTER — Inpatient Hospital Stay (HOSPITAL_COMMUNITY): Payer: PPO

## 2019-02-26 ENCOUNTER — Encounter (HOSPITAL_COMMUNITY): Payer: Self-pay | Admitting: *Deleted

## 2019-02-26 DIAGNOSIS — R7881 Bacteremia: Secondary | ICD-10-CM

## 2019-02-26 DIAGNOSIS — B028 Zoster with other complications: Secondary | ICD-10-CM

## 2019-02-26 LAB — BASIC METABOLIC PANEL
Anion gap: 9 (ref 5–15)
BUN: 20 mg/dL (ref 8–23)
CO2: 22 mmol/L (ref 22–32)
Calcium: 8.4 mg/dL — ABNORMAL LOW (ref 8.9–10.3)
Chloride: 107 mmol/L (ref 98–111)
Creatinine, Ser: 0.78 mg/dL (ref 0.44–1.00)
GFR calc Af Amer: >60 mL/min (ref >60)
GFR calc non Af Amer: >60 mL/min (ref >60)
Glucose, Bld: 134 mg/dL — ABNORMAL HIGH (ref 70–99)
Potassium: 4.1 mmol/L (ref 3.5–5.1)
Sodium: 138 mmol/L (ref 135–145)

## 2019-02-26 LAB — CULTURE, BLOOD (ROUTINE X 2): Special Requests: ADEQUATE

## 2019-02-26 LAB — CBC
HCT: 28.6 % — ABNORMAL LOW (ref 36.0–46.0)
Hemoglobin: 8.8 g/dL — ABNORMAL LOW (ref 12.0–15.0)
MCH: 28.8 pg (ref 26.0–34.0)
MCHC: 30.8 g/dL (ref 30.0–36.0)
MCV: 93.5 fL (ref 80.0–100.0)
Platelets: 220 10*3/uL (ref 150–400)
RBC: 3.06 MIL/uL — ABNORMAL LOW (ref 3.87–5.11)
RDW: 14 % (ref 11.5–15.5)
WBC: 14.2 10*3/uL — ABNORMAL HIGH (ref 4.0–10.5)
nRBC: 0 % (ref 0.0–0.2)

## 2019-02-26 LAB — GLUCOSE, CAPILLARY
Glucose-Capillary: 186 mg/dL — ABNORMAL HIGH (ref 70–99)
Glucose-Capillary: 118 mg/dL — ABNORMAL HIGH (ref 70–99)
Glucose-Capillary: 149 mg/dL — ABNORMAL HIGH (ref 70–99)
Glucose-Capillary: 144 mg/dL — ABNORMAL HIGH (ref 70–99)

## 2019-02-26 LAB — ECHOCARDIOGRAM COMPLETE

## 2019-02-26 MED ORDER — PENICILLIN G POTASSIUM 5000000 UNITS IJ SOLR
2.0000 10*6.[IU] | INTRAVENOUS | Status: DC
  Administered 2019-02-26 – 2019-03-01 (×17): 2 10*6.[IU] via INTRAVENOUS
  Filled 2019-02-26 (×21): qty 2

## 2019-02-26 NOTE — Progress Notes (Signed)
Echocardiogram 2D Echocardiogram has been performed.  Oneal Deputy Tauri Ethington 02/26/2019, 12:10 PM

## 2019-02-26 NOTE — Progress Notes (Addendum)
[facility] for Infectious Disease   Reason for visit: Follow up on bacteremia  Interval History: Strep anginosis, penicillin sensitive.  No complaints, no associated n/v/d.  No new rash. Tmax 102.9, WBC though down to 14.2.   Repeat blood cultures sent yesterday and ngtd  Physical Exam: Constitutional:  Vitals:   02/25/19 2142 02/26/19 0617  BP: 113/70 122/80  Pulse: 70 84  Resp: 16 17  Temp: 99.1 F (37.3 C) 99.1 F (37.3 C)  SpO2: 98% 100%   patient appears in NAD Eyes: anicteric Respiratory: Normal respiratory effort; CTA B Cardiovascular: RRR GI: soft, nt, nd Skin: left ear with open ulcer from scratching, crusted lesions otherwise  Review of Systems: Constitutional: negative for chills and anorexia Respiratory: negative for cough or sputum Gastrointestinal: negative for nausea, vomiting, diarrhea and abdominal pain Musculoskeletal: negative for myalgias and arthralgias  Lab Results  Component Value Date   WBC 14.2 (H) 02/26/2019   HGB 8.8 (L) 02/26/2019   HCT 28.6 (L) 02/26/2019   MCV 93.5 02/26/2019   PLT 220 02/26/2019    Lab Results  Component Value Date   CREATININE 0.78 02/26/2019   BUN 20 02/26/2019   NA 138 02/26/2019   K 4.1 02/26/2019   CL 107 02/26/2019   CO2 22 02/26/2019    Lab Results  Component Value Date   ALT 16 02/24/2019   AST 18 02/24/2019   ALKPHOS 92 02/24/2019     Microbiology: Recent Results (from the past 240 hour(s))  Urine culture     Status: None   Collection Time: 02/23/19 11:08 AM   Specimen: In/Out Cath Urine  Result Value Ref Range Status   Specimen Description   Final    IN/OUT CATH URINE Performed at [facility], [place] [address], [place], [place] [address]    Special Requests   Final    NONE Performed at [facility], [place] [address] [place], [place] [address]    Culture   Final    NO GROWTH Performed at [facility], [place] [address] [place], [place]  [address]    Report Status 02/24/2019 FINAL  Final  SARS Coronavirus 2 [facility] order, Performed in [facility] hospital lab) Nasopharyngeal Nasopharyngeal Swab     Status: None   Collection Time: 02/23/19 11:08 AM   Specimen: Nasopharyngeal Swab  Result Value Ref Range Status   SARS Coronavirus 2 NEGATIVE NEGATIVE Final    Comment: (NOTE) If result is NEGATIVE SARS-CoV-2 target nucleic acids are NOT DETECTED. The SARS-CoV-2 RNA is generally detectable in upper and lower  respiratory specimens during the acute phase of infection. The lowest  concentration of SARS-CoV-2 viral copies this assay can detect is 250  copies / mL. A negative result does not preclude SARS-CoV-2 infection  and should not be used as the sole basis for treatment or other  patient management decisions.  A negative result may occur with  improper specimen collection / handling, submission of specimen other  than nasopharyngeal swab, presence of viral mutation(s) within the  areas targeted by this assay, and inadequate number of viral copies  (<250 copies / mL). A negative result must be combined with clinical  observations, patient history, and epidemiological information. If result is POSITIVE SARS-CoV-2 target nucleic acids are DETECTED. The SARS-CoV-2 RNA is generally detectable in upper and lower  respiratory specimens dur ing the acute phase of infection.  Positive  results are indicative of active infection with SARS-CoV-2.  Clinical  correlation with patient history and other diagnostic information is  necessary to determine patient infection status.  Positive results do  not rule out bacterial infection or co-infection with other viruses. If result is PRESUMPTIVE POSTIVE SARS-CoV-2 nucleic acids MAY BE PRESENT.   A presumptive positive result was obtained on the submitted specimen  and confirmed on repeat testing.  While 2019 novel coronavirus  (SARS-CoV-2) nucleic acids may be present in the submitted  sample  additional confirmatory testing may be necessary for epidemiological  and / or clinical management purposes  to differentiate between  SARS-CoV-2 and other Sarbecovirus currently known to infect humans.  If clinically indicated additional testing with an alternate test  methodology [phone]([id number]) is advised. The SARS-CoV-2 RNA is generally  detectable in upper and lower respiratory sp ecimens during the acute  phase of infection. The expected result is Negative. Fact Sheet for Patients:  BoilerBrush.com.cyhttps://www.fda.gov/media/136312/download Fact Sheet for Healthcare Providers: https://pope.com/https://www.fda.gov/media/136313/download This test is not yet approved or cleared by the [place] FDA and has been authorized for detection and/or diagnosis of SARS-CoV-2 by FDA under an Emergency Use Authorization (EUA).  This EUA will remain in effect (meaning this test can be used) for the duration of the COVID-19 declaration under Section 564(b)(1) of the Act, 21 U.S.C. section 360bbb-3(b)(1), unless the authorization is terminated or revoked sooner. Performed at [facility], [address] [place], [place] [address]   Blood culture (routine x 2)     Status: Abnormal   Collection Time: 02/23/19 11:08 AM   Specimen: BLOOD  Result Value Ref Range Status   Specimen Description   Final    BLOOD Performed at [facility], [address] [place], [place] [address]    Special Requests   Final    BOTTLES DRAWN AEROBIC AND ANAEROBIC Blood Culture results may not be optimal due to an inadequate volume of blood received in culture bottles Performed at [facility], [address] [place], [place] [address]    Culture  Setup Time   Final    GRAM POSITIVE COCCI IN BOTH AEROBIC AND ANAEROBIC BOTTLES CRITICAL VALUE NOTED.  VALUE IS CONSISTENT WITH PREVIOUSLY REPORTED AND CALLED VALUE.    Culture (A)  Final    STREPTOCOCCUS ANGINOSIS SUSCEPTIBILITIES  PERFORMED ON PREVIOUS CULTURE WITHIN THE LAST 5 DAYS. Performed at [facility], [address] [place], [place] [address]    Report Status 02/26/2019 FINAL  Final  Blood culture (routine x 2)     Status: Abnormal   Collection Time: 02/23/19 11:08 AM   Specimen: BLOOD  Result Value Ref Range Status   Specimen Description   Final    BLOOD Performed at [facility], [address] [place], [place] [address]    Special Requests   Final    BOTTLES DRAWN AEROBIC AND ANAEROBIC Blood Culture adequate volume Performed at [facility], [address] [place], [place] [address]    Culture  Setup Time   Final    GRAM POSITIVE COCCI IN BOTH AEROBIC AND ANAEROBIC BOTTLES CRITICAL RESULT CALLED TO, READ BACK BY AND VERIFIED WITH: Jodi MarbleL. Poindexter PharmD 9:25 02/24/19 ([email or web address]) Performed at [facility], [address] [place], [place] [address]    Culture STREPTOCOCCUS ANGINOSIS (A)  Final   Report Status 02/26/2019 FINAL  Final   Organism ID, Bacteria STREPTOCOCCUS ANGINOSIS  Final      Susceptibility   Streptococcus anginosis - MIC*  PENICILLIN 0.12 SENSITIVE Sensitive     CEFTRIAXONE 0.5 SENSITIVE Sensitive     ERYTHROMYCIN >=8 RESISTANT Resistant     LEVOFLOXACIN 1 SENSITIVE Sensitive     VANCOMYCIN 1 SENSITIVE Sensitive     * STREPTOCOCCUS ANGINOSIS  Blood Culture ID Panel (Reflexed)     Status: Abnormal   Collection Time: 02/23/19 11:08 AM  Result Value Ref Range Status   Enterococcus species NOT DETECTED NOT DETECTED Final   Listeria monocytogenes NOT DETECTED NOT DETECTED Final   Staphylococcus species NOT DETECTED NOT DETECTED Final   Staphylococcus aureus (BCID) NOT DETECTED NOT DETECTED Final   Streptococcus species DETECTED (A) NOT DETECTED Final    Comment: Not Enterococcus species, Streptococcus agalactiae, Streptococcus pyogenes, or Streptococcus pneumoniae. CRITICAL RESULT CALLED TO, READ BACK BY AND VERIFIED WITH: Jodi Marble PharmD 9:25 02/24/19 ([email or web address])    Streptococcus agalactiae NOT DETECTED NOT DETECTED Final   Streptococcus pneumoniae NOT DETECTED NOT DETECTED Final   Streptococcus pyogenes NOT DETECTED NOT DETECTED Final   Acinetobacter baumannii NOT DETECTED NOT DETECTED Final   Enterobacteriaceae species NOT DETECTED NOT DETECTED Final   Enterobacter cloacae complex NOT DETECTED NOT DETECTED Final   Escherichia coli NOT DETECTED NOT DETECTED Final   Klebsiella oxytoca NOT DETECTED NOT DETECTED Final   Klebsiella pneumoniae NOT DETECTED NOT DETECTED Final   Proteus species NOT DETECTED NOT DETECTED Final   Serratia marcescens NOT DETECTED NOT DETECTED Final   Haemophilus influenzae NOT DETECTED NOT DETECTED Final   Neisseria meningitidis NOT DETECTED NOT DETECTED Final   Pseudomonas aeruginosa NOT DETECTED NOT DETECTED Final   Candida albicans NOT DETECTED NOT DETECTED Final   Candida glabrata NOT DETECTED NOT DETECTED Final   Candida krusei NOT DETECTED NOT DETECTED Final   Candida parapsilosis NOT DETECTED NOT DETECTED Final   Candida tropicalis NOT DETECTED NOT DETECTED Final    Comment: Performed at [facility], [address], [place], [place] [address]  Culture, blood (single)     Status: None (Preliminary result)   Collection Time: 02/25/19  4:13 AM   Specimen: BLOOD  Result Value Ref Range Status   Specimen Description   Final    BLOOD LEFT ARM Performed at [facility], [address], [place], [place] [address]    Special Requests   Final    BOTTLES DRAWN AEROBIC ONLY Blood Culture adequate volume Performed at [facility], [address] [place], [place] [address]    Culture   Final    NO GROWTH < 24 HOURS Performed at [facility], [address], [place], [place] [address]    Report Status PENDING  Incomplete    Impression/Plan:  1. Strep anginosis bacteremia - no focal symptoms of throat/ENT complaints, no  abdominal complaints and she feels well.  She did though have a fever spike yesterday but her WBC is improving so as long as her repeat blood cultures remain negative and she does not have any focal concerns, will continue with her IV therapy.   I am going to check a TTE Will change to penicillin for targeted therapy  2.  Medication monitoring - will monitor cbc, creat  3.  Fever - temperature spike, remains high but no fever today.  Will continue to monitor.

## 2019-02-26 NOTE — TOC Initial Note (Signed)
Transition of Care [facility]) - Initial/Assessment Note    Patient Details  Name: Sally Ramirez MRN: [id number] Date of Birth: [birth year]  [facility]) CM/SW Contact:    Wende Neighbors, LCSW Phone Number: 02/26/2019, 2:08 PM  Clinical Narrative:    CSW met patient at bedside to discuss discharge plans. Patient does not want to go to rehab she stated she would like to go home with [facility]. Patient is being followed by Carolynn Sayers from [facility] for IV [facility]. Patient is needing HHPT and Pam is assisting CSW in finding [facility] agency that can take patient.  Patients PCP is Kelton Pillar, MD from [facility]               Expected Discharge Plan: [place] Barriers to Discharge: Continued Medical Work up   Patient Goals and CMS Choice Patient states their goals for this hospitalization and ongoing recovery are:: to be home   Choice offered to / list presented to : Patient  Expected Discharge Plan and Services Expected Discharge Plan: [place]       Living arrangements for the past 2 months: Single Family Home                                      Prior Living Arrangements/Services Living arrangements for the past 2 months: Single Family Home Lives with:: Self Patient language and need for interpreter reviewed:: Yes        Need for Family Participation in Patient Care: Yes (Comment) Care giver support system in place?: Yes (comment)   Criminal Activity/Legal Involvement Pertinent to Current Situation/Hospitalization: No - Comment as needed  Activities of Daily Living Home Assistive Devices/Equipment: Dentures (specify type)(Upper and Lower partials) ADL Screening (condition at time of admission) Patient's cognitive ability adequate to safely complete daily activities?: Yes Is the patient deaf or have difficulty hearing?: Yes Does the patient have difficulty seeing, even when wearing glasses/contacts?: No Does the patient  have difficulty concentrating, remembering, or making decisions?: No Patient able to express need for assistance with ADLs?: Yes Does the patient have difficulty dressing or bathing?: Yes Independently performs ADLs?: No Does the patient have difficulty walking or climbing stairs?: Yes Weakness of Legs: Both Weakness of Arms/Hands: None  Permission Sought/Granted      Share Information with NAME: Tangala Wiegert     Permission granted to share info w Relationship: [phone]  Permission granted to share info w Contact Information: spouse  Emotional Assessment Appearance:: Appears stated age   Affect (typically observed): Pleasant Orientation: : Oriented to Self, Oriented to Place, Oriented to  Time, Oriented to Situation Alcohol / Substance Use: Not Applicable Psych Involvement: No (comment)  Admission diagnosis:  SIRS (systemic inflammatory response syndrome) (HCC) [R65.10] Fever, unspecified fever cause [R50.9] Patient Active Problem List   Diagnosis Date Noted  . Shingles 02/24/2019  . Hypertension 02/24/2019  . Type 2 diabetes mellitus ([place]) 02/24/2019  . Neuropathy 02/24/2019  . Normocytic anemia 02/24/2019  . Poor dentition 02/24/2019  . Streptococcal bacteremia 02/23/2019   PCP:  Patient, No Pcp Per Pharmacy:   [facility] [place], [place] [address] Phone: [phone] Fax: [phone]     Social Determinants of Health (SDOH) Interventions    Readmission Risk Interventions No  flowsheet data found.

## 2019-02-26 NOTE — Progress Notes (Signed)
Physical Therapy Treatment Patient Details Name: Sally Ramirez MRN: [id number] DOB: October 28, 1945 Today's Date: 02/26/2019    History of Present Illness 73 y.o. female with Past medical history of HTN, neuropathy, type II DM, hypothyroidism recent shingles and admitted for SIRS and generalized weakness    PT Comments    Pt reports feeling much better.  Pt ambulated in hallway good distance and denies any symptoms.  Pt did use RW to assist with support and does not typically use any assistive device at home.  Updated recommendations for HHPT.   Follow Up Recommendations  Supervision/Assistance - 24 hour;Home health PT     Equipment Recommendations  Rolling walker with 5" wheels    Recommendations for Other Services       Precautions / Restrictions Precautions Precautions: Fall    Mobility  Bed Mobility               General bed mobility comments: pt up in recliner on arrival  Transfers Overall transfer level: Needs assistance Equipment used: None Transfers: Sit to/from Stand Sit to Stand: Min guard         General transfer comment: min/guard for safety  Ambulation/Gait Ambulation/Gait assistance: Min guard Gait Distance (Feet): 400 Feet Assistive device: Rolling walker (2 wheeled) Gait Pattern/deviations: Step-through pattern;Decreased stride length     General Gait Details: a few instances of unsteadiness however utilized RW to self correct, min/guard for Futures trader    Modified Rankin (Stroke Patients Only)       Balance                                            Cognition Arousal/Alertness: Awake/alert Behavior During Therapy: WFL for tasks assessed/performed Overall Cognitive Status: Within Functional Limits for tasks assessed                                        Exercises      General Comments        Pertinent Vitals/Pain Pain Assessment: No/denies pain     Home Living                      Prior Function            PT Goals (current goals can now be found in the care plan section) Progress towards PT goals: Progressing toward goals    Frequency    Min 3X/week      PT Plan Discharge plan needs to be updated    Co-evaluation              AM-PAC PT "6 Clicks" Mobility   Outcome Measure  Help needed turning from your back to your side while in a flat bed without using bedrails?: A Little Help needed moving from lying on your back to sitting on the side of a flat bed without using bedrails?: A Little Help needed moving to and from a bed to a chair (including a wheelchair)?: A Little Help needed standing up from a chair using your arms (e.g., wheelchair or bedside chair)?: A Little Help needed to walk in hospital room?: A Little Help needed climbing 3-5 steps with a railing? : A  Little 6 Click Score: 18    End of Session   Activity Tolerance: Patient tolerated treatment well Patient left: in chair;with call bell/phone within reach   PT Visit Diagnosis: Other abnormalities of gait and mobility (R26.89);Unsteadiness on feet (R26.81)     Time: 2263-3354 PT Time Calculation (min) (ACUTE ONLY): 11 min  Charges:  $Gait Training: 8-22 mins                     [name], PT, [place] Office: [phone] Pager: [phone]  Trena Platt 02/26/2019, 3:51 PM

## 2019-02-26 NOTE — Plan of Care (Signed)
  Problem: Nutrition: Goal: Adequate nutrition will be maintained Outcome: Progressing   Problem: Safety: Goal: Ability to remain free from injury will improve Outcome: Progressing   Problem: Skin Integrity: Goal: Risk for impaired skin integrity will decrease Outcome: Progressing   

## 2019-02-26 NOTE — Progress Notes (Signed)
[facility] Progress Note  Patient: Sally Ramirez SLH:[id number]   PCP: Patient, No Pcp Per DOB: 10/10/1945   DOA: 02/23/2019   DOS: 02/26/2019   Date of Service: the patient was seen and examined on 02/26/2019  Chief Complaint  Patient presents with  . Weakness    Brief hospital course: Pt. with PMH of  HTN, neuropathy, type II DM, hypothyroidism recent shingles; presented with complain of fall and generalized weakness, was found to have streptococcal bacteremia.  Currently further plan is continue IV antibiotics.  Subjective: No acute complaint no nausea no vomiting continues to have pain in her ear but no itching.  No diarrhea.  Assessment and Plan: 1.    Sepsis secondary to streptococcal pneumoniae bacteremia. Sepsis ruled in Source possibly ear infection. Presents with generalized weakness. Found to have leukocytosis, fever, tachycardia. Blood cultures came back positive for strep pneumoniae. ID consulted given lack of source. Appreciate their assistance. Transitioning to IV penicillin. Echocardiogram shows no evidence of vegetation.  Likely can place a PICC line and can go home tomorrow.  2.  Poor p.o. intake, generalized weakness, recent shingles. Fall Provide pain control. Continue gabapentin. PT OT consultation. Further work-up for generalized weakness for other etiology.  No focal deficit at the time of my evaluation to suggest any stroke or any other acute abnormality. Oral intake is improving.  Weakness is also improving.  PT recommendation is also significantly better.  3.  Essential hypertension. Continue home medication other than hydrochlorothiazide.  4.  Type 2 diabetes mellitus. Normal hemoglobin A1c. Currently on sliding scale insulin. Holding oral hypoglycemic agents  5.  Hypothyroidism. Continue Synthroid.  6.  Acute kidney injury.  Resolved In the setting of poor p.o. intake from her recent shingles as well as ongoing use of  antihypertensive diuretic. Received IV hydration.  Currently no fluids. Holding lisinopril holding HCTZ.  7.  Recent shingles. Patient had 2 weeks of history of shingles treated with oral Valtrex outpatient. Most of the lesions are completely healed. 2 lesions on the ER does show some ulceration although this is from patient's itching rather than active vesicular lesions. I do not think the patient requires airborne isolation and ID agrees with it. Appreciate their assistance. Continue pain control.  Diet: Regular diet  DVT Prophylaxis: Subcutaneous Heparin   Advance goals of care discussion: Full code  Family Communication: family was present at bedside, at the time of interview. The pt provided permission to discuss medical plan with the family. Opportunity was given to ask question and all questions were answered satisfactorily.   Disposition:  Discharge to home.  Consultants: Infectious disease Procedures: None  Scheduled Meds: . brimonidine  1 drop Both Eyes BID   And  . timolol  1 drop Both Eyes BID  . enoxaparin (LOVENOX) injection  40 mg Subcutaneous Daily  . gabapentin  300 mg Oral BID  . insulin aspart  0-15 Units Subcutaneous TID WC  . insulin aspart  0-5 Units Subcutaneous QHS  . levothyroxine  50 mcg Oral Daily  . sodium chloride flush  3 mL Intravenous Q12H   Continuous Infusions: . lactated ringers 75 mL/hr at 02/26/19 1657  . pencillin G potassium IV 2 Million Units (02/26/19 1658)   PRN Meds: acetaminophen **OR** acetaminophen, calamine, hydrOXYzine, ondansetron **OR** ondansetron (ZOFRAN) IV, senna-docusate, traMADol Antibiotics: Anti-infectives (From admission, onward)   Start     Dose/Rate Route Frequency Ordered Stop   02/26/19 1030  penicillin G potassium 2 Million Units in dextrose 5 %  50 mL IVPB    Note to Pharmacy: Pharmacy may adjust dose as needed.   2 Million Units 100 mL/hr over 30 Minutes Intravenous Every 4 hours 02/26/19 0951      02/24/19 1300  cefTRIAXone (ROCEPHIN) 2 g in sodium chloride 0.9 % 100 mL IVPB  Status:  Discontinued     2 g 200 mL/hr over 30 Minutes Intravenous Every 24 hours 02/24/19 1009 02/26/19 0951   02/23/19 1230  azithromycin (ZITHROMAX) 500 mg in sodium chloride 0.9 % 250 mL IVPB     500 mg 250 mL/hr over 60 Minutes Intravenous  Once 02/23/19 1223 02/23/19 1631   02/23/19 1230  cefTRIAXone (ROCEPHIN) 2 g in sodium chloride 0.9 % 100 mL IVPB     2 g 200 mL/hr over 30 Minutes Intravenous  Once 02/23/19 1223 02/23/19 1332       Objective: Physical Exam: Vitals:   02/25/19 2142 02/26/19 0617 02/26/19 1100 02/26/19 1241  BP: 113/70 122/80  116/76  Pulse: 70 84  72  Resp: 16 17  18   Temp: 99.1 F (37.3 C) 99.1 F (37.3 C)  99.6 F (37.6 C)  TempSrc:    Oral  SpO2: 98% 100%  98%  Weight:   58.8 kg   Height:   5\' 4"  (1.626 m)     Intake/Output Summary (Last 24 hours) at 02/26/2019 1804 Last data filed at 02/26/2019 1500 Gross per 24 hour  Intake 1040 ml  Output -  Net 1040 ml   Filed Weights   02/26/19 1100  Weight: 58.8 kg   General: alert and oriented to time, place, and person. Appear in mild distress, affect appropriate Eyes: PERRL, Conjunctiva normal ENT: Oral Mucosa Clear, moist  Neck: no JVD, no Abnormal Mass Or lumps Cardiovascular: S1 and S2 Present, no Murmur, peripheral pulses symmetrical Respiratory: good respiratory effort, Bilateral Air entry equal and Decreased, no signs of accessory muscle use, Clear to Auscultation, no Crackles, no wheezes Abdomen: Bowel Sound present, Soft and no tenderness, no hernia Skin: no rashes, healed lesions off Extremities: no Pedal edema, no calf tenderness Neurologic: without any new focal findings Gait not checked due to patient safety concerns  Data Reviewed: I have personally reviewed and interpreted daily labs, tele strips, imagings as discussed above. I reviewed all nursing notes, pharmacy notes, vitals, pertinent old records  I have discussed plan of care as described above with RN and patient/family.  CBC: Recent Labs  Lab 02/23/19 1108 02/24/19 0438 02/25/19 0413 02/26/19 0346  WBC 21.5* 22.2* 18.7* 14.2*  NEUTROABS 18.2* 18.4*  --   --   HGB 10.5* 10.0* 8.8* 8.8*  HCT 34.1* 33.3* 29.3* 28.6*  MCV 94.2 95.7 96.1 93.5  PLT 405* 303 194 734   Basic Metabolic Panel: Recent Labs  Lab 02/23/19 1108 02/24/19 0438 02/25/19 0413 02/26/19 0346  NA 135 137 135 138  K 4.3 3.7 4.1 4.1  CL 97* 104 106 107  CO2 22 22 21* 22  GLUCOSE 179* 140* 103* 134*  BUN 27* 25* 23 20  CREATININE 1.31* 1.00 0.94 0.78  CALCIUM 9.4 8.5* 8.4* 8.4*    Liver Function Tests: Recent Labs  Lab 02/23/19 1108 02/24/19 0438  AST 20 18  ALT 20 16  ALKPHOS 100 92  BILITOT 0.6 0.7  PROT 9.2* 8.1  ALBUMIN 3.8 3.2*   Recent Labs  Lab 02/23/19 1108  LIPASE 17   No results for input(s): AMMONIA in the last 168 hours. Coagulation Profile: No results  for input(s): INR, PROTIME in the last 168 hours. Cardiac Enzymes: Recent Labs  Lab 02/24/19 0438  CKTOTAL 65   BNP (last 3 results) No results for input(s): PROBNP in the last 8760 hours. CBG: Recent Labs  Lab 02/25/19 1621 02/25/19 2139 02/26/19 0801 02/26/19 1140 02/26/19 1634  GLUCAP 168* 150* 186* 118* 149*   Studies: No results found.   Time spent: 35 minutes  Author: Lynden OxfordPranav Kendall Justo, MD [facility] 02/26/2019 6:04 PM  To reach On-call, see care teams to locate the attending and reach out to them via [email or web address]. If 7PM-7AM, please contact night-coverage If you still have difficulty reaching the attending provider, please page the [facility] (Director [facility] Call) for [facility] on amion for assistance.

## 2019-02-27 LAB — GLUCOSE, CAPILLARY
Glucose-Capillary: 137 mg/dL — ABNORMAL HIGH (ref 70–99)
Glucose-Capillary: 174 mg/dL — ABNORMAL HIGH (ref 70–99)
Glucose-Capillary: 124 mg/dL — ABNORMAL HIGH (ref 70–99)
Glucose-Capillary: 169 mg/dL — ABNORMAL HIGH (ref 70–99)

## 2019-02-27 LAB — BASIC METABOLIC PANEL
Anion gap: 7 (ref 5–15)
BUN: 17 mg/dL (ref 8–23)
CO2: 25 mmol/L (ref 22–32)
Calcium: 8.4 mg/dL — ABNORMAL LOW (ref 8.9–10.3)
Chloride: 106 mmol/L (ref 98–111)
Creatinine, Ser: 0.81 mg/dL (ref 0.44–1.00)
GFR calc Af Amer: >60 mL/min (ref >60)
GFR calc non Af Amer: >60 mL/min (ref >60)
Glucose, Bld: 151 mg/dL — ABNORMAL HIGH (ref 70–99)
Potassium: 3.9 mmol/L (ref 3.5–5.1)
Sodium: 138 mmol/L (ref 135–145)

## 2019-02-27 LAB — CBC
HCT: 26.3 % — ABNORMAL LOW (ref 36.0–46.0)
Hemoglobin: 7.9 g/dL — ABNORMAL LOW (ref 12.0–15.0)
MCH: 28.7 pg (ref 26.0–34.0)
MCHC: 30 g/dL (ref 30.0–36.0)
MCV: 95.6 fL (ref 80.0–100.0)
Platelets: 235 10*3/uL (ref 150–400)
RBC: 2.75 MIL/uL — ABNORMAL LOW (ref 3.87–5.11)
RDW: 14.3 % (ref 11.5–15.5)
WBC: 11 10*3/uL — ABNORMAL HIGH (ref 4.0–10.5)
nRBC: 0 % (ref 0.0–0.2)

## 2019-02-27 NOTE — Plan of Care (Signed)

## 2019-02-27 NOTE — Progress Notes (Signed)
Patient states she "can't use timolol eye gtts because they "infect/irritate" her eyes. She states she has alerted her eye Md. Eulas Post, RN

## 2019-02-27 NOTE — Progress Notes (Signed)
Patient ID: Shaleah Nissley, female   DOB: 1946-01-30, 73 y.o.   MRN: [id number]          [facility]    Date of Admission:  02/23/2019   Total days of antibiotics 5        Day 2 penicillin  Ms. Wiens is now afebrile.  There was no evidence of endocarditis by TTE and repeat blood cultures are negative at 48 hours.  If she remains afebrile and blood cultures remain negative tomorrow I would consider discharge home on oral amoxicillin to complete a total of 2 weeks of therapy for Strep anginosis bacteremia complicating her recent shingles.         Michel Bickers, MD [facility] [phone] pager   [phone] cell 02/27/2019, 9:49 AM

## 2019-02-27 NOTE — Progress Notes (Signed)
[facility] Progress Note  Patient: Sally Ramirez ZOX:[id number]   PCP: Patient, No Pcp Per DOB: [birth year]   DOA: 02/23/2019   DOS: 02/27/2019   Date of Service: the patient was seen and examined on 02/27/2019  Chief Complaint  Patient presents with   Weakness    Brief hospital course: Pt. with PMH of  HTN, neuropathy, type II DM, hypothyroidism recent shingles; presented with complain of fall and generalized weakness, was found to have streptococcal bacteremia.  Currently further plan is continue IV antibiotics.  Subjective: Patient denies any acute complaint.  No nausea no vomiting.  Continues to have complaint of ear pain.  Assessment and Plan: 1.    Sepsis secondary to streptococcal pneumoniae bacteremia. Sepsis ruled in Source possibly ear infection. Presents with generalized weakness. Found to have leukocytosis, fever, tachycardia. Blood cultures came back positive for strep pneumoniae. ID consulted given lack of source. Appreciate their assistance. Transitioning to IV penicillin.  Echocardiogram shows no evidence of vegetation.   ID recommends that the patient does not require any PICC line and can go home on oral amoxicillin as long as her blood culture remains negative tomorrow.  2.  Poor p.o. intake, generalized weakness, recent shingles. Fall Provide pain control. Continue gabapentin. PT OT consultation. Further work-up for generalized weakness for other etiology.  No focal deficit at the time of my evaluation to suggest any stroke or any other acute abnormality. Oral intake is improving.  Weakness is also improving.  PT recommendation is also significantly better.  3.  Essential hypertension. Continue home medication other than hydrochlorothiazide.  4.  Type 2 diabetes mellitus. Normal hemoglobin A1c. Currently on sliding scale insulin. Holding oral hypoglycemic agents  5.  Hypothyroidism. Continue Synthroid.  6.  Acute kidney injury.   Resolved In the setting of poor p.o. intake from her recent shingles as well as ongoing use of antihypertensive diuretic. Received IV hydration.  Currently no fluids. Holding lisinopril holding HCTZ.  7.  Recent shingles. Patient had 2 weeks of history of shingles treated with oral Valtrex outpatient. Most of the lesions are completely healed. 2 lesions on the ER does show some ulceration although this is from patient's itching rather than active vesicular lesions. I do not think the patient requires airborne isolation and ID agrees with it. Appreciate their assistance. Continue pain control.  Diet: Regular diet  DVT Prophylaxis: Subcutaneous Heparin   Advance goals of care discussion: Full code  Family Communication: family was present at bedside, at the time of interview. The pt provided permission to discuss medical plan with the family. Opportunity was given to ask question and all questions were answered satisfactorily.   Disposition:  Discharge to home.  02/28/2019.  Consultants: Infectious disease Procedures: None  Scheduled Meds:  brimonidine  1 drop Both Eyes BID   And   timolol  1 drop Both Eyes BID   enoxaparin (LOVENOX) injection  40 mg Subcutaneous Daily   gabapentin  300 mg Oral BID   insulin aspart  0-15 Units Subcutaneous TID WC   insulin aspart  0-5 Units Subcutaneous QHS   levothyroxine  50 mcg Oral Daily   sodium chloride flush  3 mL Intravenous Q12H   Continuous Infusions:  lactated ringers 75 mL/hr at 02/27/19 1004   pencillin G potassium IV 2 Million Units (02/27/19 1325)   PRN Meds: acetaminophen **OR** acetaminophen, calamine, hydrOXYzine, ondansetron **OR** ondansetron (ZOFRAN) IV, senna-docusate, traMADol Antibiotics: Anti-infectives (From admission, onward)   Start     Dose/Rate Route  Frequency Ordered Stop   02/26/19 1030  penicillin G potassium 2 Million Units in dextrose 5 % 50 mL IVPB    Note to Pharmacy: Pharmacy may adjust dose  as needed.   2 Million Units 100 mL/hr over 30 Minutes Intravenous Every 4 hours 02/26/19 0951     02/24/19 1300  cefTRIAXone (ROCEPHIN) 2 g in sodium chloride 0.9 % 100 mL IVPB  Status:  Discontinued     2 g 200 mL/hr over 30 Minutes Intravenous Every 24 hours 02/24/19 1009 02/26/19 0951   02/23/19 1230  azithromycin (ZITHROMAX) 500 mg in sodium chloride 0.9 % 250 mL IVPB     500 mg 250 mL/hr over 60 Minutes Intravenous  Once 02/23/19 1223 02/23/19 1631   02/23/19 1230  cefTRIAXone (ROCEPHIN) 2 g in sodium chloride 0.9 % 100 mL IVPB     2 g 200 mL/hr over 30 Minutes Intravenous  Once 02/23/19 1223 02/23/19 1332       Objective: Physical Exam: Vitals:   02/26/19 1241 02/26/19 2132 02/27/19 0505 02/27/19 1311  BP: 116/76 125/83 (!) 156/91 126/78  Pulse: 72 85 77 72  Resp: 18 18 18 18   Temp: 99.6 F (37.6 C) 98.8 F (37.1 C) 98.2 F (36.8 C) 98.9 F (37.2 C)  TempSrc: Oral Oral Oral Oral  SpO2: 98% 97% 100% 98%  Weight:      Height:        Intake/Output Summary (Last 24 hours) at 02/27/2019 1825 Last data filed at 02/27/2019 1700 Gross per 24 hour  Intake 1992.14 ml  Output 100 ml  Net 1892.14 ml   Filed Weights   02/26/19 1100  Weight: 58.8 kg   General: alert and oriented to time, place, and person. Appear in mild distress, affect appropriate Eyes: PERRL, Conjunctiva normal ENT: Oral Mucosa Clear, moist  Neck: no JVD, no Abnormal Mass Or lumps Cardiovascular: S1 and S2 Present, no Murmur, peripheral pulses symmetrical Respiratory: good respiratory effort, Bilateral Air entry equal and Decreased, no signs of accessory muscle use, Clear to Auscultation, no Crackles, no wheezes Abdomen: Bowel Sound present, Soft and no tenderness, no hernia Skin: no rashes, healed lesions off Extremities: no Pedal edema, no calf tenderness Neurologic: without any new focal findings Gait not checked due to patient safety concerns  Data Reviewed: I have personally reviewed and  interpreted daily labs, tele strips, imagings as discussed above. I reviewed all nursing notes, pharmacy notes, vitals, pertinent old records I have discussed plan of care as described above with RN and patient/family.  CBC: Recent Labs  Lab 02/23/19 1108 02/24/19 0438 02/25/19 0413 02/26/19 0346 02/27/19 0421  WBC 21.5* 22.2* 18.7* 14.2* 11.0*  NEUTROABS 18.2* 18.4*  --   --   --   HGB 10.5* 10.0* 8.8* 8.8* 7.9*  HCT 34.1* 33.3* 29.3* 28.6* 26.3*  MCV 94.2 95.7 96.1 93.5 95.6  PLT 405* 303 194 220 235   Basic Metabolic Panel: Recent Labs  Lab 02/23/19 1108 02/24/19 0438 02/25/19 0413 02/26/19 0346 02/27/19 0421  NA 135 137 135 138 138  K 4.3 3.7 4.1 4.1 3.9  CL 97* 104 106 107 106  CO2 22 22 21* 22 25  GLUCOSE 179* 140* 103* 134* 151*  BUN 27* 25* 23 20 17   CREATININE 1.31* 1.00 0.94 0.78 0.81  CALCIUM 9.4 8.5* 8.4* 8.4* 8.4*    Liver Function Tests: Recent Labs  Lab 02/23/19 1108 02/24/19 0438  AST 20 18  ALT 20 16  ALKPHOS 100 92  BILITOT 0.6 0.7  PROT 9.2* 8.1  ALBUMIN 3.8 3.2*   Recent Labs  Lab 02/23/19 1108  LIPASE 17   No results for input(s): AMMONIA in the last 168 hours. Coagulation Profile: No results for input(s): INR, PROTIME in the last 168 hours. Cardiac Enzymes: Recent Labs  Lab 02/24/19 0438  CKTOTAL 65   BNP (last 3 results) No results for input(s): PROBNP in the last 8760 hours. CBG: Recent Labs  Lab 02/26/19 1634 02/26/19 2154 02/27/19 0812 02/27/19 1207 02/27/19 1655  GLUCAP 149* 144* 137* 174* 124*   Studies: No results found.   Time spent: 35 minutes  Author: Berle Mull, MD [facility] 02/27/2019 6:25 PM  To reach On-call, see care teams to locate the attending and reach out to them via [email or web address]. If 7PM-7AM, please contact night-coverage If you still have difficulty reaching the attending provider, please page the [facility] (Director on Call) for [facility] on amion for assistance.

## 2019-02-28 ENCOUNTER — Encounter (HOSPITAL_COMMUNITY): Payer: Self-pay

## 2019-02-28 LAB — BASIC METABOLIC PANEL
Anion gap: 8 (ref 5–15)
BUN: 16 mg/dL (ref 8–23)
CO2: 25 mmol/L (ref 22–32)
Calcium: 8.6 mg/dL — ABNORMAL LOW (ref 8.9–10.3)
Chloride: 104 mmol/L (ref 98–111)
Creatinine, Ser: 0.9 mg/dL (ref 0.44–1.00)
GFR calc Af Amer: >60 mL/min (ref >60)
GFR calc non Af Amer: >60 mL/min (ref >60)
Glucose, Bld: 176 mg/dL — ABNORMAL HIGH (ref 70–99)
Potassium: 4 mmol/L (ref 3.5–5.1)
Sodium: 137 mmol/L (ref 135–145)

## 2019-02-28 LAB — CBC
HCT: 28.4 % — ABNORMAL LOW (ref 36.0–46.0)
Hemoglobin: 8.7 g/dL — ABNORMAL LOW (ref 12.0–15.0)
MCH: 29 pg (ref 26.0–34.0)
MCHC: 30.6 g/dL (ref 30.0–36.0)
MCV: 94.7 fL (ref 80.0–100.0)
Platelets: 262 10*3/uL (ref 150–400)
RBC: 3 MIL/uL — ABNORMAL LOW (ref 3.87–5.11)
RDW: 14.2 % (ref 11.5–15.5)
WBC: 7.9 10*3/uL (ref 4.0–10.5)
nRBC: 0 % (ref 0.0–0.2)

## 2019-02-28 LAB — GLUCOSE, CAPILLARY
Glucose-Capillary: 177 mg/dL — ABNORMAL HIGH (ref 70–99)
Glucose-Capillary: 237 mg/dL — ABNORMAL HIGH (ref 70–99)
Glucose-Capillary: 215 mg/dL — ABNORMAL HIGH (ref 70–99)
Glucose-Capillary: 142 mg/dL — ABNORMAL HIGH (ref 70–99)

## 2019-02-28 MED ORDER — PNEUMOCOCCAL VAC POLYVALENT 25 MCG/0.5ML IJ INJ
0.5000 mL | INJECTION | INTRAMUSCULAR | Status: DC | PRN
  Filled 2019-02-28: qty 0.5

## 2019-02-28 MED ORDER — PNEUMOCOCCAL VAC POLYVALENT 25 MCG/0.5ML IJ INJ: 0.5000 mL | INJECTION | INTRAMUSCULAR | Status: DC

## 2019-02-28 NOTE — Progress Notes (Signed)
PROGRESS NOTE    Sally Ramirez  JHE:[id number] DOB: 07/18/1945 DOA: 02/23/2019 PCP: Patient, No Pcp Per    Brief Narrative:  73 year old history of hypertension, neuropathy, type 2 diabetes, hypothyroidism, recent shingles infection presented with complaints of fall and generalized weakness.  She was found to have streptococcal bacteremia.   Assessment & Plan:   Principal Problem:   Streptococcal bacteremia Active Problems:   Shingles   Hypertension   Type 2 diabetes mellitus (HCC)   Neuropathy   Normocytic anemia   Poor dentition  Sepsis present on admission secondary to streptococcal bacteremia, possible source superadded bacterial infection of shingles rash on her left ear: Blood cultures 02/23/2019, Streptococcus anginosis Blood cultures, 02/25/2019, no growth so far Fever, 10 1 in the morning of 02/28/2019, repeat blood cultures done. TTE no vegetation. Followed by infectious disease, recommended total 2 weeks of amoxicillin therapy once patient is afebrile.   Recent shingle infection, poor appetite and generalized weakness: Adequate pain control.  Gabapentin.  Encourage oral intake.  Ambulation.  Hypertension: Blood pressure stable.  Type 2 diabetes: Stable.  On sliding scale in the hospital.  Resume hypoglycemics on discharge.  Hypothyroidism: Stable.  On Synthroid.  Acute kidney injury: Improved and resolved.  DVT prophylaxis: Heparin subcu Code Status: Full code Family Communication: None Disposition Plan: Home.  Anticipate tomorrow if remains afebrile.   Consultants:   Infectious disease  Procedures:   None  Antimicrobials:   Penicillin V, 02/26/2019----   Subjective: Patient was seen and examined.  No overnight events.  Early morning she had a temperature of 101.5.  WBC count is normal.  Patient has some pain on her left ear and decreased hearing otherwise no other complaints. Ordered repeat blood cultures before giving Tylenol.  Objective:  Vitals:   02/27/19 1311 02/27/19 2150 02/28/19 0652 02/28/19 1045  BP: 126/78 (!) 148/85 (!) 144/86 94/60  Pulse: 72 83 96 76  Resp: 18 16 16 16   Temp: 98.9 F (37.2 C) 99.2 F (37.3 C) (!) 101.5 F (38.6 C) 99.4 F (37.4 C)  TempSrc: Oral Oral Oral Oral  SpO2: 98% 97% 93% 99%  Weight:      Height:        Intake/Output Summary (Last 24 hours) at 02/28/2019 1418 Last data filed at 02/28/2019 0842 Gross per 24 hour  Intake 2223.41 ml  Output 1300 ml  Net 923.41 ml   Filed Weights   02/26/19 1100  Weight: 58.8 kg    Examination:  General exam: Appears calm and comfortable  Respiratory system: Clear to auscultation. Respiratory effort normal. Cardiovascular system: S1 & S2 heard, RRR. No JVD, murmurs, rubs, gallops or clicks. No pedal edema. Gastrointestinal system: Abdomen is nondistended, soft and nontender. No organomegaly or masses felt. Normal bowel sounds heard. Central nervous system: Alert and oriented. No focal neurological deficits. Extremities: Symmetric 5 x 5 power. Skin: No rashes, lesions. Healing shingles lesions left ear inferior aspect . Psychiatry: Judgement and insight appear normal. Mood & affect appropriate.     Data Reviewed: I have personally reviewed following labs and imaging studies  CBC: Recent Labs  Lab 02/23/19 1108 02/24/19 0438 02/25/19 0413 02/26/19 0346 02/27/19 0421 02/28/19 0332  WBC 21.5* 22.2* 18.7* 14.2* 11.0* 7.9  NEUTROABS 18.2* 18.4*  --   --   --   --   HGB 10.5* 10.0* 8.8* 8.8* 7.9* 8.7*  HCT 34.1* 33.3* 29.3* 28.6* 26.3* 28.4*  MCV 94.2 95.7 96.1 93.5 95.6 94.7  PLT 405* 303 194 220  235 262   Basic Metabolic Panel: Recent Labs  Lab 02/24/19 0438 02/25/19 0413 02/26/19 0346 02/27/19 0421 02/28/19 0332  NA 137 135 138 138 137  K 3.7 4.1 4.1 3.9 4.0  CL 104 106 107 106 104  CO2 22 21* GLUCOSE 140* 103* 134* 151* 176*  BUN 25* CREATININE 1.00 0.94 0.78 0.81 0.90  CALCIUM 8.5* 8.4* 8.4*  8.4* 8.6*   GFR: Estimated Creatinine Clearance: 49.5 mL/min (by C-G formula based on SCr of 0.9 mg/dL). Liver Function Tests: Recent Labs  Lab 02/23/19 1108 02/24/19 0438  AST 20 18  ALT 20 16  ALKPHOS 100 92  BILITOT 0.6 0.7  PROT 9.2* 8.1  ALBUMIN 3.8 3.2*   Recent Labs  Lab 02/23/19 1108  LIPASE 17   No results for input(s): AMMONIA in the last 168 hours. Coagulation Profile: No results for input(s): INR, PROTIME in the last 168 hours. Cardiac Enzymes: Recent Labs  Lab 02/24/19 0438  CKTOTAL 65   BNP (last 3 results) No results for input(s): PROBNP in the last 8760 hours. HbA1C: No results for input(s): HGBA1C in the last 72 hours. CBG: Recent Labs  Lab 02/27/19 1207 02/27/19 1655 02/27/19 2146 02/28/19 0749 02/28/19 1131  GLUCAP 174* 124* 169* 177* 237*   Lipid Profile: No results for input(s): CHOL, HDL, LDLCALC, TRIG, CHOLHDL, LDLDIRECT in the last 72 hours. Thyroid Function Tests: No results for input(s): TSH, T4TOTAL, FREET4, T3FREE, THYROIDAB in the last 72 hours. Anemia Panel: No results for input(s): VITAMINB12, FOLATE, FERRITIN, TIBC, IRON, RETICCTPCT in the last 72 hours. Sepsis Labs: Recent Labs  Lab 02/23/19 1108 02/24/19 0438  PROCALCITON  --  2.21  LATICACIDVEN 1.2  --     Recent Results (from the past 240 hour(s))  Urine culture     Status: None   Collection Time: 02/23/19 11:08 AM   Specimen: In/Out Cath Urine  Result Value Ref Range Status   Specimen Description   Final    IN/OUT CATH URINE Performed at [facility], [address] [place], [place] [address]    Special Requests   Final    NONE Performed at [facility], [address] [place], [place] [address]    Culture   Final    NO GROWTH Performed at [facility], [address], [place], [place] [address]    Report Status 02/24/2019 FINAL  Final  SARS Coronavirus 2 [facility] order, Performed in [facility] hospital  lab) Nasopharyngeal Nasopharyngeal Swab     Status: None   Collection Time: 02/23/19 11:08 AM   Specimen: Nasopharyngeal Swab  Result Value Ref Range Status   SARS Coronavirus 2 NEGATIVE NEGATIVE Final    Comment: (NOTE) If result is NEGATIVE SARS-CoV-2 target nucleic acids are NOT DETECTED. The SARS-CoV-2 RNA is generally detectable in upper and lower  respiratory specimens during the acute phase of infection. The lowest  concentration of SARS-CoV-2 viral copies this assay can detect is 250  copies / mL. A negative result does not preclude SARS-CoV-2 infection  and should not be used as the sole basis for treatment or other  patient management decisions.  A negative result may occur with  improper specimen collection / handling, submission of specimen other  than nasopharyngeal swab, presence of viral mutation(s) within the  areas targeted by this assay, and inadequate number of viral copies  (<250 copies / mL). A negative result must be  combined with clinical  observations, patient history, and epidemiological information. If result is POSITIVE SARS-CoV-2 target nucleic acids are DETECTED. The SARS-CoV-2 RNA is generally detectable in upper and lower  respiratory specimens dur ing the acute phase of infection.  Positive  results are indicative of active infection with SARS-CoV-2.  Clinical  correlation with patient history and other diagnostic information is  necessary to determine patient infection status.  Positive results do  not rule out bacterial infection or co-infection with other viruses. If result is PRESUMPTIVE POSTIVE SARS-CoV-2 nucleic acids MAY BE PRESENT.   A presumptive positive result was obtained on the submitted specimen  and confirmed on repeat testing.  While 2019 novel coronavirus  (SARS-CoV-2) nucleic acids may be present in the submitted sample  additional confirmatory testing may be necessary for epidemiological  and / or clinical management purposes  to  differentiate between  SARS-CoV-2 and other Sarbecovirus currently known to infect humans.  If clinically indicated additional testing with an alternate test  methodology [phone] is advised. The SARS-CoV-2 RNA is generally  detectable in upper and lower respiratory sp ecimens during the acute  phase of infection. The expected result is Negative. Fact Sheet for Patients:  BoilerBrush.com.cyhttps://www.fda.gov/media/136312/download Fact Sheet for Healthcare Providers: https://pope.com/https://www.fda.gov/media/136313/download This test is not yet approved or cleared by the [place] FDA and has been authorized for detection and/or diagnosis of SARS-CoV-2 by FDA under an Emergency Use Authorization (EUA).  This EUA will remain in effect (meaning this test can be used) for the duration of the COVID-19 declaration under Section 564(b)(1) of the Act, 21 U.S.C. section 360bbb-3(b)(1), unless the authorization is terminated or revoked sooner. Performed at [facility], [address] [place], [place] [address]   Blood culture (routine x 2)     Status: Abnormal   Collection Time: 02/23/19 11:08 AM   Specimen: BLOOD  Result Value Ref Range Status   Specimen Description   Final    BLOOD Performed at [facility], [address] [place], [place] [address]    Special Requests   Final    BOTTLES DRAWN AEROBIC AND ANAEROBIC Blood Culture results may not be optimal due to an inadequate volume of blood received in culture bottles Performed at [facility], [address] [place], [place] [address]    Culture  Setup Time   Final    GRAM POSITIVE COCCI IN BOTH AEROBIC AND ANAEROBIC BOTTLES CRITICAL VALUE NOTED.  VALUE IS CONSISTENT WITH PREVIOUSLY REPORTED AND CALLED VALUE.    Culture (A)  Final    STREPTOCOCCUS ANGINOSIS SUSCEPTIBILITIES PERFORMED ON PREVIOUS CULTURE WITHIN THE LAST 5 DAYS. Performed at [facility], [address] [place], [place]  [address]    Report Status 02/26/2019 FINAL  Final  Blood culture (routine x 2)     Status: Abnormal   Collection Time: 02/23/19 11:08 AM   Specimen: BLOOD  Result Value Ref Range Status   Specimen Description   Final    BLOOD Performed at [facility], [address] [place], [place] [address]    Special Requests   Final    BOTTLES DRAWN AEROBIC AND ANAEROBIC Blood Culture adequate volume Performed at [facility], [address] [place], [place] [address]    Culture  Setup Time   Final    GRAM POSITIVE COCCI IN BOTH AEROBIC AND ANAEROBIC BOTTLES CRITICAL RESULT CALLED TO, READ BACK BY AND VERIFIED WITH: Jodi MarbleL. Poindexter PharmD 9:25 02/24/19 (  [email or web address]) Performed at [facility], [place] [address], [place], [place] [address]    Culture STREPTOCOCCUS ANGINOSIS (A)  Final   Report Status 02/26/2019 FINAL  Final   Organism ID, Bacteria STREPTOCOCCUS ANGINOSIS  Final      Susceptibility   Streptococcus anginosis - MIC*    PENICILLIN 0.12 SENSITIVE Sensitive     CEFTRIAXONE 0.5 SENSITIVE Sensitive     ERYTHROMYCIN >=8 RESISTANT Resistant     LEVOFLOXACIN 1 SENSITIVE Sensitive     VANCOMYCIN 1 SENSITIVE Sensitive     * STREPTOCOCCUS ANGINOSIS  Blood Culture ID Panel (Reflexed)     Status: Abnormal   Collection Time: 02/23/19 11:08 AM  Result Value Ref Range Status   Enterococcus species NOT DETECTED NOT DETECTED Final   Listeria monocytogenes NOT DETECTED NOT DETECTED Final   Staphylococcus species NOT DETECTED NOT DETECTED Final   Staphylococcus aureus (BCID) NOT DETECTED NOT DETECTED Final   Streptococcus species DETECTED (A) NOT DETECTED Final    Comment: Not Enterococcus species, Streptococcus agalactiae, Streptococcus pyogenes, or Streptococcus pneumoniae. CRITICAL RESULT CALLED TO, READ BACK BY AND VERIFIED WITH: Elenore Paddy PharmD 9:25 02/24/19 ([email or web address])    Streptococcus agalactiae NOT DETECTED NOT DETECTED Final   Streptococcus  pneumoniae NOT DETECTED NOT DETECTED Final   Streptococcus pyogenes NOT DETECTED NOT DETECTED Final   Acinetobacter baumannii NOT DETECTED NOT DETECTED Final   Enterobacteriaceae species NOT DETECTED NOT DETECTED Final   Enterobacter cloacae complex NOT DETECTED NOT DETECTED Final   Escherichia coli NOT DETECTED NOT DETECTED Final   Klebsiella oxytoca NOT DETECTED NOT DETECTED Final   Klebsiella pneumoniae NOT DETECTED NOT DETECTED Final   Proteus species NOT DETECTED NOT DETECTED Final   Serratia marcescens NOT DETECTED NOT DETECTED Final   Haemophilus influenzae NOT DETECTED NOT DETECTED Final   Neisseria meningitidis NOT DETECTED NOT DETECTED Final   Pseudomonas aeruginosa NOT DETECTED NOT DETECTED Final   Candida albicans NOT DETECTED NOT DETECTED Final   Candida glabrata NOT DETECTED NOT DETECTED Final   Candida krusei NOT DETECTED NOT DETECTED Final   Candida parapsilosis NOT DETECTED NOT DETECTED Final   Candida tropicalis NOT DETECTED NOT DETECTED Final    Comment: Performed at [facility], [place], [place], [place] [address]  Culture, blood (single)     Status: None (Preliminary result)   Collection Time: 02/25/19  4:13 AM   Specimen: BLOOD  Result Value Ref Range Status   Specimen Description   Final    BLOOD LEFT ARM Performed at [facility] [address], [place], [place] [address]    Special Requests   Final    BOTTLES DRAWN AEROBIC ONLY Blood Culture adequate volume Performed at [facility] [address] [place], [place] [address]    Culture   Final    NO GROWTH 3 DAYS Performed at [facility], [place] [address] [place], [place] [address]    Report Status PENDING  Incomplete         Radiology Studies: No results found.      Scheduled Meds: . brimonidine  1 drop Both Eyes BID   And  . timolol  1 drop Both Eyes BID  . enoxaparin (LOVENOX) injection  40 mg Subcutaneous Daily  . gabapentin   300 mg Oral BID  . insulin aspart  0-15 Units Subcutaneous TID WC  . insulin aspart  0-5 Units Subcutaneous QHS  . levothyroxine  50 mcg Oral Daily  .  sodium chloride flush  3 mL Intravenous Q12H   Continuous Infusions: . lactated ringers 75 mL/hr at 02/28/19 0204  . pencillin G potassium IV 2 Million Units (02/28/19 1610)     LOS: 4 days    Time spent: 25 minutes    Dorcas Carrow, MD [facility] Pager [phone]  If 7PM-7AM, please contact night-coverage [email or web address] Password [email or web address] 02/28/2019, 2:18 PM

## 2019-02-28 NOTE — Progress Notes (Signed)
Notified on call of Temp 101.9.

## 2019-02-28 NOTE — Progress Notes (Signed)
   02/28/19 0657  MEWS Assessment  Is this an acute change? Yes  MEWS guidelines implemented *See Row Information* Yellow  Provider Notification  Provider Name/Title Bodenheimer  Date Provider Notified 02/28/19  Time Provider Notified [phone]  Notification Type Page  Notification Reason Change in status  Temp 101.9

## 2019-02-28 NOTE — Progress Notes (Signed)
Pt had noted abnormal beats on telemetry. She was asymptomatic. Md notified and orders received. Eulas Post, RN

## 2019-03-01 LAB — CBC
HCT: 26.8 % — ABNORMAL LOW (ref 36.0–46.0)
Hemoglobin: 8.1 g/dL — ABNORMAL LOW (ref 12.0–15.0)
MCH: 29 pg (ref 26.0–34.0)
MCHC: 30.2 g/dL (ref 30.0–36.0)
MCV: 96.1 fL (ref 80.0–100.0)
Platelets: 270 10*3/uL (ref 150–400)
RBC: 2.79 MIL/uL — ABNORMAL LOW (ref 3.87–5.11)
RDW: 14.6 % (ref 11.5–15.5)
WBC: 10.6 10*3/uL — ABNORMAL HIGH (ref 4.0–10.5)
nRBC: 0 % (ref 0.0–0.2)

## 2019-03-01 LAB — BASIC METABOLIC PANEL
Anion gap: 9 (ref 5–15)
BUN: 18 mg/dL (ref 8–23)
CO2: 24 mmol/L (ref 22–32)
Calcium: 8.7 mg/dL — ABNORMAL LOW (ref 8.9–10.3)
Chloride: 106 mmol/L (ref 98–111)
Creatinine, Ser: 0.9 mg/dL (ref 0.44–1.00)
GFR calc Af Amer: >60 mL/min (ref >60)
GFR calc non Af Amer: >60 mL/min (ref >60)
Glucose, Bld: 176 mg/dL — ABNORMAL HIGH (ref 70–99)
Potassium: 3.8 mmol/L (ref 3.5–5.1)
Sodium: 139 mmol/L (ref 135–145)

## 2019-03-01 LAB — GLUCOSE, CAPILLARY
Glucose-Capillary: 160 mg/dL — ABNORMAL HIGH (ref 70–99)
Glucose-Capillary: 212 mg/dL — ABNORMAL HIGH (ref 70–99)

## 2019-03-01 MED ORDER — AMOXICILLIN 500 MG PO TABS: 500.0000 mg | ORAL_TABLET | Freq: Three times a day (TID) | ORAL | 0 refills | Status: DC

## 2019-03-01 MED ORDER — PNEUMOCOCCAL VAC POLYVALENT 25 MCG/0.5ML IJ INJ
0.5000 mL | INJECTION | Freq: Once | INTRAMUSCULAR | Status: AC
  Administered 2019-03-01: 13:00:00 0.5 mL via INTRAMUSCULAR
  Filled 2019-03-01: qty 0.5

## 2019-03-01 NOTE — Discharge Summary (Signed)
Physician Discharge Summary  Sally Ramirez ZOX:[id number] DOB: [birth year] DOA: 02/23/2019  PCP: Patient, No Pcp Per  Admit date: 02/23/2019 Discharge date: 03/01/2019  Admitted From: Home Disposition: Home  Recommendations for Outpatient Follow-up:  1. Follow up with PCP in 1-2 weeks 2.   Home Health: Not applicable Equipment/Devices: Not applicable  Discharge Condition: Stable CODE STATUS: Full code Diet recommendation: Low-carb diet  Discharge summary: 73 year old history of hypertension, neuropathy, type 2 diabetes, hypothyroidism, recent shingles infection presented with complaints of fall and generalized weakness.  She was found to have streptococcal bacteremia.  Sepsis present on admission secondary to streptococcal bacteremia, possible source superadded bacterial infection of shingles rash on her left ear: Blood cultures 02/23/2019, Streptococcus anginosis Blood cultures, 02/25/2019, Fever, 101 in the morning of 02/28/2019, no growth in blood cultures. TTE no vegetation. Afebrile last 24 hours. Followed by infectious disease, recommended total 2 weeks of amoxicillin therapy.  Discharged on amoxicillin.  Her blood pressures and diabetes and hypothyroidism they all remained stable.  She had suffered acute kidney injury that has improved now.  Patient is largely asymptomatic today.  She has done very good clinical recovery.  She is going home today.  Discharge Diagnoses:  Principal Problem:   Streptococcal bacteremia Active Problems:   Shingles   Hypertension   Type 2 diabetes mellitus (HCC)   Neuropathy   Normocytic anemia   Poor dentition    Discharge Instructions  Discharge Instructions    Diet Carb Modified   Complete by: As directed    Increase activity slowly   Complete by: As directed      Allergies as of 03/01/2019   No Known Allergies     Medication List    TAKE these medications   amLODipine 5 MG tablet Commonly known as: NORVASC Take 5 mg by  mouth daily.   amoxicillin 500 MG tablet Commonly known as: AMOXIL Take 1 tablet (500 mg total) by mouth 3 (three) times daily for 11 days.   augmented betamethasone dipropionate 0.05 % ointment Commonly known as: DIPROLENE-AF Apply 1 application topically daily.   Combigan 0.2-0.5 % ophthalmic solution Generic drug: brimonidine-timolol Place 1 drop into both eyes 2 (two) times daily.   gabapentin 300 MG capsule Commonly known as: NEURONTIN Take 300 mg by mouth 2 (two) times daily.   glimepiride 4 MG tablet Commonly known as: AMARYL Take 4 mg by mouth daily.   hydrochlorothiazide 12.5 MG tablet Commonly known as: HYDRODIURIL Take 12.5 mg by mouth daily.   Jardiance 25 MG Tabs tablet Generic drug: empagliflozin Take 25 mg by mouth daily.   levothyroxine 50 MCG tablet Commonly known as: SYNTHROID Take 50 mcg by mouth daily.   lisinopril 20 MG tablet Commonly known as: ZESTRIL Take 20 mg by mouth daily.   metFORMIN 1000 MG tablet Commonly known as: GLUCOPHAGE Take 1,000 mg by mouth 2 (two) times daily.       No Known Allergies  Consultations:  Infectious disease   Procedures/Studies: Dg Chest Portable 1 View  Result Date: 02/23/2019 CLINICAL DATA:  Weakness. EXAM: PORTABLE CHEST 1 VIEW COMPARISON:  July 20, 2010 FINDINGS: The heart size and mediastinal contours are within normal limits. Both lungs are clear. The visualized skeletal structures are unremarkable. IMPRESSION: No active disease. Electronically Signed   By: Gerome Sam III M.D   On: 02/23/2019 10:36      Subjective: Patient was seen and examined.  No overnight events.  Afebrile last 24 hours.  Very eager to go home  today.   Discharge Exam: Vitals:   02/28/19 2256 03/01/19 0313  BP: 117/75 (!) 149/81  Pulse: 68 72  Resp: 18 18  Temp: 98.7 F (37.1 C) 98.7 F (37.1 C)  SpO2: 100% 100%   Vitals:   02/28/19 1723 02/28/19 1911 02/28/19 2256 03/01/19 0313  BP: 108/68 119/80 117/75  (!) 149/81  Pulse: 69 75 68 72  Resp: Temp: 97.8 F (36.6 C) 98.4 F (36.9 C) 98.7 F (37.1 C) 98.7 F (37.1 C)  TempSrc: Oral Oral Oral Oral  SpO2: 100% 100% 100% 100%  Weight:      Height:        General: Pt is alert, awake, not in acute distress, on room air.  Eating breakfast. Cardiovascular: RRR, S1/S2 +, no rubs, no gallops Respiratory: CTA bilaterally, no wheezing, no rhonchi Abdominal: Soft, NT, ND, bowel sounds + Extremities: no edema, no cyanosis, Patient has a shingles rash that is healing on her left ear lobule and preauricular area.    The results of significant diagnostics from this hospitalization (including imaging, microbiology, ancillary and laboratory) are listed below for reference.     Microbiology: Recent Results (from the past 240 hour(s))  Urine culture     Status: None   Collection Time: 02/23/19 11:08 AM   Specimen: In/Out Cath Urine  Result Value Ref Range Status   Specimen Description   Final    IN/OUT CATH URINE Performed at [facility], [address] [place], [place] [address]    Special Requests   Final    NONE Performed at [facility], [address], [place], [place] [address]    Culture   Final    NO GROWTH Performed at [facility], [address] [place], [place] [address]    Report Status 02/24/2019 FINAL  Final  SARS Coronavirus 2 [facility] order, Performed in [facility] hospital lab) Nasopharyngeal Nasopharyngeal Swab     Status: None   Collection Time: 02/23/19 11:08 AM   Specimen: Nasopharyngeal Swab  Result Value Ref Range Status   SARS Coronavirus 2 NEGATIVE NEGATIVE Final    Comment: (NOTE) If result is NEGATIVE SARS-CoV-2 target nucleic acids are NOT DETECTED. The SARS-CoV-2 RNA is generally detectable in upper and lower  respiratory specimens during the acute phase of infection. The lowest  concentration of SARS-CoV-2 viral copies this assay can detect is  250  copies / mL. A negative result does not preclude SARS-CoV-2 infection  and should not be used as the sole basis for treatment or other  patient management decisions.  A negative result may occur with  improper specimen collection / handling, submission of specimen other  than nasopharyngeal swab, presence of viral mutation(s) within the  areas targeted by this assay, and inadequate number of viral copies  (<250 copies / mL). A negative result must be combined with clinical  observations, patient history, and epidemiological information. If result is POSITIVE SARS-CoV-2 target nucleic acids are DETECTED. The SARS-CoV-2 RNA is generally detectable in upper and lower  respiratory specimens dur ing the acute phase of infection.  Positive  results are indicative of active infection with SARS-CoV-2.  Clinical  correlation with patient history and other diagnostic information is  necessary to determine patient infection status.  Positive results do  not rule out bacterial infection or co-infection with other viruses. If result is PRESUMPTIVE POSTIVE SARS-CoV-2 nucleic acids MAY BE PRESENT.   A presumptive positive result  was obtained on the submitted specimen  and confirmed on repeat testing.  While 2019 novel coronavirus  (SARS-CoV-2) nucleic acids may be present in the submitted sample  additional confirmatory testing may be necessary for epidemiological  and / or clinical management purposes  to differentiate between  SARS-CoV-2 and other Sarbecovirus currently known to infect humans.  If clinically indicated additional testing with an alternate test  methodology [phone]) is advised. The SARS-CoV-2 RNA is generally  detectable in upper and lower respiratory sp ecimens during the acute  phase of infection. The expected result is Negative. Fact Sheet for Patients:  BoilerBrush.com.cy Fact Sheet for Healthcare  Providers: https://pope.com/ This test is not yet approved or cleared by the [place] FDA and has been authorized for detection and/or diagnosis of SARS-CoV-2 by FDA under an Emergency Use Authorization (EUA).  This EUA will remain in effect (meaning this test can be used) for the duration of the COVID-19 declaration under Section 564(b)(1) of the Act, 21 U.S.C. section 360bbb-3(b)(1), unless the authorization is terminated or revoked sooner. Performed at [facility], [address], [place], [place] [address]   Blood culture (routine x 2)     Status: Abnormal   Collection Time: 02/23/19 11:08 AM   Specimen: BLOOD  Result Value Ref Range Status   Specimen Description   Final    BLOOD Performed at [facility], [address], [place], [place] [address]    Special Requests   Final    BOTTLES DRAWN AEROBIC AND ANAEROBIC Blood Culture results may not be optimal due to an inadequate volume of blood received in culture bottles Performed at [facility], [address], [place], [place] [address]    Culture  Setup Time   Final    GRAM POSITIVE COCCI IN BOTH AEROBIC AND ANAEROBIC BOTTLES CRITICAL VALUE NOTED.  VALUE IS CONSISTENT WITH PREVIOUSLY REPORTED AND CALLED VALUE.    Culture (A)  Final    STREPTOCOCCUS ANGINOSIS SUSCEPTIBILITIES PERFORMED ON PREVIOUS CULTURE WITHIN THE LAST 5 DAYS. Performed at [facility], [address], [place], [place] [address]    Report Status 02/26/2019 FINAL  Final  Blood culture (routine x 2)     Status: Abnormal   Collection Time: 02/23/19 11:08 AM   Specimen: BLOOD  Result Value Ref Range Status   Specimen Description   Final    BLOOD Performed at [facility], [address], [place], [place] [address]    Special Requests   Final    BOTTLES DRAWN AEROBIC AND ANAEROBIC Blood Culture adequate volume Performed at [facility], [address] [place], [place] [address]    Culture  Setup Time   Final    GRAM POSITIVE COCCI IN BOTH AEROBIC AND ANAEROBIC BOTTLES CRITICAL RESULT CALLED TO, READ BACK BY AND VERIFIED WITH: Jodi Marble PharmD 9:25 02/24/19 ([email or web address]) Performed at [facility], [address], [place], [place] [address]    Culture STREPTOCOCCUS ANGINOSIS (A)  Final   Report Status 02/26/2019 FINAL  Final   Organism ID, Bacteria STREPTOCOCCUS ANGINOSIS  Final      Susceptibility   Streptococcus anginosis - MIC*    PENICILLIN 0.12 SENSITIVE Sensitive     CEFTRIAXONE 0.5 SENSITIVE Sensitive     ERYTHROMYCIN >=8 RESISTANT Resistant     LEVOFLOXACIN 1 SENSITIVE Sensitive     VANCOMYCIN 1 SENSITIVE Sensitive     * STREPTOCOCCUS ANGINOSIS  Blood Culture ID  Panel (Reflexed)     Status: Abnormal   Collection Time: 02/23/19 11:08 AM  Result Value Ref Range Status   Enterococcus species NOT DETECTED NOT DETECTED Final   Listeria monocytogenes NOT DETECTED NOT DETECTED Final   Staphylococcus species NOT DETECTED NOT DETECTED Final   Staphylococcus aureus (BCID) NOT DETECTED NOT DETECTED Final   Streptococcus species DETECTED (A) NOT DETECTED Final    Comment: Not Enterococcus species, Streptococcus agalactiae, Streptococcus pyogenes, or Streptococcus pneumoniae. CRITICAL RESULT CALLED TO, READ BACK BY AND VERIFIED WITH: Jodi Marble PharmD 9:25 02/24/19 ([email or web address])    Streptococcus agalactiae NOT DETECTED NOT DETECTED Final   Streptococcus pneumoniae NOT DETECTED NOT DETECTED Final   Streptococcus pyogenes NOT DETECTED NOT DETECTED Final   Acinetobacter baumannii NOT DETECTED NOT DETECTED Final   Enterobacteriaceae species NOT DETECTED NOT DETECTED Final   Enterobacter cloacae complex NOT DETECTED NOT DETECTED Final   Escherichia coli NOT DETECTED NOT DETECTED Final   Klebsiella oxytoca NOT DETECTED NOT DETECTED Final   Klebsiella pneumoniae NOT DETECTED NOT DETECTED Final   Proteus  species NOT DETECTED NOT DETECTED Final   Serratia marcescens NOT DETECTED NOT DETECTED Final   Haemophilus influenzae NOT DETECTED NOT DETECTED Final   Neisseria meningitidis NOT DETECTED NOT DETECTED Final   Pseudomonas aeruginosa NOT DETECTED NOT DETECTED Final   Candida albicans NOT DETECTED NOT DETECTED Final   Candida glabrata NOT DETECTED NOT DETECTED Final   Candida krusei NOT DETECTED NOT DETECTED Final   Candida parapsilosis NOT DETECTED NOT DETECTED Final   Candida tropicalis NOT DETECTED NOT DETECTED Final    Comment: Performed at [facility], [address] [place], [place] [address]  Culture, blood (single)     Status: None (Preliminary result)   Collection Time: 02/25/19  4:13 AM   Specimen: BLOOD  Result Value Ref Range Status   Specimen Description   Final    BLOOD LEFT ARM Performed at [facility], [address], [place], [place] [address]    Special Requests   Final    BOTTLES DRAWN AEROBIC ONLY Blood Culture adequate volume Performed at [facility], [address] [place], [place] [address]    Culture   Final    NO GROWTH 4 DAYS Performed at [facility], [address] [place], [place] [address]    Report Status PENDING  Incomplete  Culture, blood (routine x 2)     Status: None (Preliminary result)   Collection Time: 02/28/19  8:16 AM   Specimen: BLOOD  Result Value Ref Range Status   Specimen Description   Final    BLOOD LEFT ANTECUBITAL Performed at [facility], [address] [place], [place] [address]    Special Requests   Final    BOTTLES DRAWN AEROBIC AND ANAEROBIC Blood Culture adequate volume Performed at [facility], [address] [place], [place] [address]    Culture   Final    NO GROWTH < 24 HOURS Performed at [facility], [address], [place], [place] [address]    Report Status PENDING  Incomplete  Culture, blood (routine x 2)      Status: None (Preliminary result)   Collection Time: 02/28/19  8:27 AM   Specimen: BLOOD  Result Value Ref Range Status   Specimen Description   Final    BLOOD RIGHT HAND Performed at [facility], [address] [place], [place] [address]  Special Requests   Final    BOTTLES DRAWN AEROBIC ONLY Blood Culture adequate volume Performed at [facility], [address] [place], [place] [address]    Culture   Final    NO GROWTH < 24 HOURS Performed at [facility], [address] [place], [place] [address]    Report Status PENDING  Incomplete     Labs: BNP (last 3 results) No results for input(s): BNP in the last 8760 hours. Basic Metabolic Panel: Recent Labs  Lab 02/25/19 0413 02/26/19 0346 02/27/19 0421 02/28/19 0332 03/01/19 0424  NA 135 138 138 137 139  K 4.1 4.1 3.9 4.0 3.8  CL 106 107 106 104 106  CO2 21* GLUCOSE 103* 134* 151* 176* 176*  BUN CREATININE 0.94 0.78 0.81 0.90 0.90  CALCIUM 8.4* 8.4* 8.4* 8.6* 8.7*   Liver Function Tests: Recent Labs  Lab 02/23/19 1108 02/24/19 0438  AST 20 18  ALT 20 16  ALKPHOS 100 92  BILITOT 0.6 0.7  PROT 9.2* 8.1  ALBUMIN 3.8 3.2*   Recent Labs  Lab 02/23/19 1108  LIPASE 17   No results for input(s): AMMONIA in the last 168 hours. CBC: Recent Labs  Lab 02/23/19 1108 02/24/19 0438 02/25/19 0413 02/26/19 0346 02/27/19 0421 02/28/19 0332 03/01/19 0424  WBC 21.5* 22.2* 18.7* 14.2* 11.0* 7.9 10.6*  NEUTROABS 18.2* 18.4*  --   --   --   --   --   HGB 10.5* 10.0* 8.8* 8.8* 7.9* 8.7* 8.1*  HCT 34.1* 33.3* 29.3* 28.6* 26.3* 28.4* 26.8*  MCV 94.2 95.7 96.1 93.5 95.6 94.7 96.1  PLT 405* 303 194 220 235 262 270   Cardiac Enzymes: Recent Labs  Lab 02/24/19 0438  CKTOTAL 65   BNP: Invalid input(s): POCBNP CBG: Recent Labs  Lab 02/28/19 0749 02/28/19 1131 02/28/19 1643 02/28/19 2252 03/01/19 0738  GLUCAP 177* 237* 215* 142* 160*    D-Dimer No results for input(s): DDIMER in the last 72 hours. Hgb A1c No results for input(s): HGBA1C in the last 72 hours. Lipid Profile No results for input(s): CHOL, HDL, LDLCALC, TRIG, CHOLHDL, LDLDIRECT in the last 72 hours. Thyroid function studies No results for input(s): TSH, T4TOTAL, T3FREE, THYROIDAB in the last 72 hours.  Invalid input(s): FREET3 Anemia work up No results for input(s): VITAMINB12, FOLATE, FERRITIN, TIBC, IRON, RETICCTPCT in the last 72 hours. Urinalysis    Component Value Date/Time   COLORURINE YELLOW 02/23/2019 1108   APPEARANCEUR CLEAR 02/23/2019 1108   LABSPEC 1.021 02/23/2019 1108   PHURINE 5.0 02/23/2019 1108   GLUCOSEU >=500 (A) 02/23/2019 1108   HGBUR SMALL (A) 02/23/2019 1108   BILIRUBINUR NEGATIVE 02/23/2019 1108   KETONESUR 20 (A) 02/23/2019 1108   PROTEINUR 100 (A) 02/23/2019 1108   UROBILINOGEN 0.2 07/20/2010 2137   NITRITE NEGATIVE 02/23/2019 1108   LEUKOCYTESUR NEGATIVE 02/23/2019 1108   Sepsis Labs Invalid input(s): PROCALCITONIN,  WBC,  LACTICIDVEN Microbiology Recent Results (from the past 240 hour(s))  Urine culture     Status: None   Collection Time: 02/23/19 11:08 AM   Specimen: In/Out Cath Urine  Result Value Ref Range Status   Specimen Description   Final    IN/OUT CATH URINE Performed at [facility], [address], [place], [place] [address]    Special Requests   Final    NONE Performed at [facility], [address]  [place], [place] [address]    Culture   Final    NO GROWTH Performed at [facility], [address], [place], [place] [address]    Report Status 02/24/2019 FINAL  Final  SARS Coronavirus 2 [facility] order, Performed in [facility] hospital lab) Nasopharyngeal Nasopharyngeal Swab     Status: None   Collection Time: 02/23/19 11:08 AM   Specimen: Nasopharyngeal Swab  Result Value Ref Range Status   SARS Coronavirus 2 NEGATIVE NEGATIVE Final     Comment: (NOTE) If result is NEGATIVE SARS-CoV-2 target nucleic acids are NOT DETECTED. The SARS-CoV-2 RNA is generally detectable in upper and lower  respiratory specimens during the acute phase of infection. The lowest  concentration of SARS-CoV-2 viral copies this assay can detect is 250  copies / mL. A negative result does not preclude SARS-CoV-2 infection  and should not be used as the sole basis for treatment or other  patient management decisions.  A negative result may occur with  improper specimen collection / handling, submission of specimen other  than nasopharyngeal swab, presence of viral mutation(s) within the  areas targeted by this assay, and inadequate number of viral copies  (<250 copies / mL). A negative result must be combined with clinical  observations, patient history, and epidemiological information. If result is POSITIVE SARS-CoV-2 target nucleic acids are DETECTED. The SARS-CoV-2 RNA is generally detectable in upper and lower  respiratory specimens dur ing the acute phase of infection.  Positive  results are indicative of active infection with SARS-CoV-2.  Clinical  correlation with patient history and other diagnostic information is  necessary to determine patient infection status.  Positive results do  not rule out bacterial infection or co-infection with other viruses. If result is PRESUMPTIVE POSTIVE SARS-CoV-2 nucleic acids MAY BE PRESENT.   A presumptive positive result was obtained on the submitted specimen  and confirmed on repeat testing.  While 2019 novel coronavirus  (SARS-CoV-2) nucleic acids may be present in the submitted sample  additional confirmatory testing may be necessary for epidemiological  and / or clinical management purposes  to differentiate between  SARS-CoV-2 and other Sarbecovirus currently known to infect humans.  If clinically indicated additional testing with an alternate test  methodology [phone] is advised. The SARS-CoV-2  RNA is generally  detectable in upper and lower respiratory sp ecimens during the acute  phase of infection. The expected result is Negative. Fact Sheet for Patients:  BoilerBrush.com.cy Fact Sheet for Healthcare Providers: https://pope.com/ This test is not yet approved or cleared by the [place] FDA and has been authorized for detection and/or diagnosis of SARS-CoV-2 by FDA under an Emergency Use Authorization (EUA).  This EUA will remain in effect (meaning this test can be used) for the duration of the COVID-19 declaration under Section 564(b)(1) of the Act, 21 U.S.C. section 360bbb-3(b)(1), unless the authorization is terminated or revoked sooner. Performed at [facility], [address] [place], [place] [address]   Blood culture (routine x 2)     Status: Abnormal   Collection Time: 02/23/19 11:08 AM   Specimen: BLOOD  Result Value Ref Range Status   Specimen Description   Final    BLOOD Performed at [facility], [address] [place], [place] [address]    Special Requests   Final    BOTTLES DRAWN AEROBIC AND ANAEROBIC Blood Culture results may not be optimal due to an inadequate volume of blood received in culture bottles Performed at [facility], [address] [place], [place] [address]    Culture  Setup Time   Final    GRAM POSITIVE COCCI IN BOTH AEROBIC AND ANAEROBIC BOTTLES CRITICAL VALUE NOTED.  VALUE IS CONSISTENT WITH PREVIOUSLY REPORTED AND CALLED VALUE.    Culture (A)  Final    STREPTOCOCCUS ANGINOSIS SUSCEPTIBILITIES PERFORMED ON PREVIOUS CULTURE WITHIN THE LAST 5 DAYS. Performed at [facility], [address] [place], [place] [address]    Report Status 02/26/2019 FINAL  Final  Blood culture (routine x 2)     Status: Abnormal   Collection Time: 02/23/19 11:08 AM   Specimen: BLOOD  Result Value Ref Range Status   Specimen Description    Final    BLOOD Performed at [facility], [address] [place], [place] [address]    Special Requests   Final    BOTTLES DRAWN AEROBIC AND ANAEROBIC Blood Culture adequate volume Performed at [facility], [address] [place], [place] [address]    Culture  Setup Time   Final    GRAM POSITIVE COCCI IN BOTH AEROBIC AND ANAEROBIC BOTTLES CRITICAL RESULT CALLED TO, READ BACK BY AND VERIFIED WITH: Jodi MarbleL. Poindexter PharmD 9:25 02/24/19 ([email or web address]) Performed at [facility], [address] [place], [place] [address]    Culture STREPTOCOCCUS ANGINOSIS (A)  Final   Report Status 02/26/2019 FINAL  Final   Organism ID, Bacteria STREPTOCOCCUS ANGINOSIS  Final      Susceptibility   Streptococcus anginosis - MIC*    PENICILLIN 0.12 SENSITIVE Sensitive     CEFTRIAXONE 0.5 SENSITIVE Sensitive     ERYTHROMYCIN >=8 RESISTANT Resistant     LEVOFLOXACIN 1 SENSITIVE Sensitive     VANCOMYCIN 1 SENSITIVE Sensitive     * STREPTOCOCCUS ANGINOSIS  Blood Culture ID Panel (Reflexed)     Status: Abnormal   Collection Time: 02/23/19 11:08 AM  Result Value Ref Range Status   Enterococcus species NOT DETECTED NOT DETECTED Final   Listeria monocytogenes NOT DETECTED NOT DETECTED Final   Staphylococcus species NOT DETECTED NOT DETECTED Final   Staphylococcus aureus (BCID) NOT DETECTED NOT DETECTED Final   Streptococcus species DETECTED (A) NOT DETECTED Final    Comment: Not Enterococcus species, Streptococcus agalactiae, Streptococcus pyogenes, or Streptococcus pneumoniae. CRITICAL RESULT CALLED TO, READ BACK BY AND VERIFIED WITH: Jodi MarbleL. Poindexter PharmD 9:25 02/24/19 ([email or web address])    Streptococcus agalactiae NOT DETECTED NOT DETECTED Final   Streptococcus pneumoniae NOT DETECTED NOT DETECTED Final   Streptococcus pyogenes NOT DETECTED NOT DETECTED Final   Acinetobacter baumannii NOT DETECTED NOT DETECTED Final   Enterobacteriaceae species NOT DETECTED NOT DETECTED Final    Enterobacter cloacae complex NOT DETECTED NOT DETECTED Final   Escherichia coli NOT DETECTED NOT DETECTED Final   Klebsiella oxytoca NOT DETECTED NOT DETECTED Final   Klebsiella pneumoniae NOT DETECTED NOT DETECTED Final   Proteus species NOT DETECTED NOT DETECTED Final   Serratia marcescens NOT DETECTED NOT DETECTED Final   Haemophilus influenzae NOT DETECTED NOT DETECTED Final   Neisseria meningitidis NOT DETECTED NOT DETECTED Final   Pseudomonas aeruginosa NOT DETECTED NOT DETECTED Final   Candida albicans NOT DETECTED NOT DETECTED Final   Candida glabrata NOT DETECTED NOT DETECTED Final   Candida krusei NOT DETECTED NOT DETECTED Final   Candida parapsilosis NOT DETECTED NOT DETECTED Final   Candida tropicalis NOT DETECTED NOT DETECTED Final    Comment: Performed at [facility], [address]  [place], [place] [address]  Culture, blood (single)     Status: None (Preliminary result)   Collection Time: 02/25/19  4:13 AM   Specimen: BLOOD  Result Value Ref Range Status   Specimen Description   Final    BLOOD LEFT ARM Performed at [facility] [address], [place], [place] [address]    Special Requests   Final    BOTTLES DRAWN AEROBIC ONLY Blood Culture adequate volume Performed at [facility] [address] [place], [place] [address]    Culture   Final    NO GROWTH 4 DAYS Performed at [facility], [place] [address] [place], [place] [address]    Report Status PENDING  Incomplete  Culture, blood (routine x 2)     Status: None (Preliminary result)   Collection Time: 02/28/19  8:16 AM   Specimen: BLOOD  Result Value Ref Range Status   Specimen Description   Final    BLOOD LEFT ANTECUBITAL Performed at [facility] [address] [place], [place] [address]    Special Requests   Final    BOTTLES DRAWN AEROBIC AND ANAEROBIC Blood Culture adequate volume Performed at [facility] [address], [place], [place] [address]    Culture   Final    NO GROWTH < 24 HOURS Performed at [facility] [address], [place], [place] [address]    Report Status PENDING  Incomplete  Culture, blood (routine x 2)     Status: None (Preliminary result)   Collection Time: 02/28/19  8:27 AM   Specimen: BLOOD  Result Value Ref Range Status   Specimen Description   Final    BLOOD RIGHT HAND Performed at [facility] [address], [place], [place] [address]    Special Requests   Final    BOTTLES DRAWN AEROBIC ONLY Blood Culture adequate volume Performed at [facility] [address] [place], [place] [address]    Culture   Final    NO GROWTH < 24 HOURS Performed at [facility] [address] [place], [place] [address]    Report Status PENDING  Incomplete     Time coordinating discharge: 35 minutes  SIGNED:   Barb Merino, MD  [facility] 03/01/2019, 11:30 AM

## 2019-03-01 NOTE — Progress Notes (Signed)
PT Cancellation Note  Patient Details Name: Sally Ramirez MRN: [id number] DOB: February 07, 1946   Cancelled Treatment:    Reason Eval/Treat Not Completed: Other (comment) Pt politely declined.  She reports she is discharging home today.   [name],[name] 03/01/2019, 9:50 AM [name], PT, DPT Acute Rehabilitation Services Office: [phone] Pager: [phone]

## 2019-03-01 NOTE — Care Management Important Message (Signed)
Important Message  Patient Details IM Letter given to Cookie McGibboney RN to present to the Patient Name: Sally Ramirez MRN: [id number] Date of Birth: Jun 05, 1945   Medicare Important Message Given:  Yes     Kerin Salen 03/01/2019, 10:45 AM

## 2019-03-01 NOTE — Plan of Care (Signed)

## 2019-03-02 LAB — CULTURE, BLOOD (SINGLE)
Culture: NO GROWTH
Special Requests: ADEQUATE

## 2019-03-05 LAB — CULTURE, BLOOD (ROUTINE X 2)
Culture: NO GROWTH
Culture: NO GROWTH
Special Requests: ADEQUATE
Special Requests: ADEQUATE

## 2019-03-08 ENCOUNTER — Emergency Department (HOSPITAL_COMMUNITY)
Admission: EM | Admit: 2019-03-08 | Discharge: 2019-03-08 | Disposition: A | Discharge status: Home / Self Care | Payer: PPO | Attending: Emergency Medicine | Admitting: Emergency Medicine

## 2019-03-08 ENCOUNTER — Encounter (HOSPITAL_COMMUNITY): Payer: Self-pay

## 2019-03-08 ENCOUNTER — Other Ambulatory Visit: Payer: Self-pay

## 2019-03-08 DIAGNOSIS — Z7984 Long term (current) use of oral hypoglycemic drugs: Secondary | ICD-10-CM | POA: Insufficient documentation

## 2019-03-08 DIAGNOSIS — E119 Type 2 diabetes mellitus without complications: Secondary | ICD-10-CM | POA: Insufficient documentation

## 2019-03-08 DIAGNOSIS — B028 Zoster with other complications: Secondary | ICD-10-CM | POA: Diagnosis not present

## 2019-03-08 DIAGNOSIS — E039 Hypothyroidism, unspecified: Secondary | ICD-10-CM | POA: Diagnosis not present

## 2019-03-08 DIAGNOSIS — H60502 Unspecified acute noninfective otitis externa, left ear: Secondary | ICD-10-CM | POA: Insufficient documentation

## 2019-03-08 DIAGNOSIS — Z79899 Other long term (current) drug therapy: Secondary | ICD-10-CM | POA: Insufficient documentation

## 2019-03-08 DIAGNOSIS — I10 Essential (primary) hypertension: Secondary | ICD-10-CM | POA: Diagnosis not present

## 2019-03-08 LAB — CBC WITH DIFFERENTIAL/PLATELET
Abs Immature Granulocytes: 0.06 10*3/uL (ref 0.00–0.07)
Basophils Absolute: 0.1 10*3/uL (ref 0.0–0.1)
Basophils Relative: 0 %
Eosinophils Absolute: 0.2 10*3/uL (ref 0.0–0.5)
Eosinophils Relative: 1 %
HCT: 28.2 % — ABNORMAL LOW (ref 36.0–46.0)
Hemoglobin: 8.7 g/dL — ABNORMAL LOW (ref 12.0–15.0)
Immature Granulocytes: 1 %
Lymphocytes Relative: 22 %
Lymphs Abs: 2.7 10*3/uL (ref 0.7–4.0)
MCH: 29.2 pg (ref 26.0–34.0)
MCHC: 30.9 g/dL (ref 30.0–36.0)
MCV: 94.6 fL (ref 80.0–100.0)
Monocytes Absolute: 0.5 10*3/uL (ref 0.1–1.0)
Monocytes Relative: 4 %
Neutro Abs: 8.5 10*3/uL — ABNORMAL HIGH (ref 1.7–7.7)
Neutrophils Relative %: 72 %
Platelets: 493 10*3/uL — ABNORMAL HIGH (ref 150–400)
RBC: 2.98 MIL/uL — ABNORMAL LOW (ref 3.87–5.11)
RDW: 14.6 % (ref 11.5–15.5)
WBC: 12 10*3/uL — ABNORMAL HIGH (ref 4.0–10.5)
nRBC: 0 % (ref 0.0–0.2)

## 2019-03-08 LAB — BASIC METABOLIC PANEL
Anion gap: 11 (ref 5–15)
BUN: 15 mg/dL (ref 8–23)
CO2: 25 mmol/L (ref 22–32)
Calcium: 9.1 mg/dL (ref 8.9–10.3)
Chloride: 101 mmol/L (ref 98–111)
Creatinine, Ser: 0.84 mg/dL (ref 0.44–1.00)
GFR calc Af Amer: >60 mL/min (ref >60)
GFR calc non Af Amer: >60 mL/min (ref >60)
Glucose, Bld: 97 mg/dL (ref 70–99)
Potassium: 4 mmol/L (ref 3.5–5.1)
Sodium: 137 mmol/L (ref 135–145)

## 2019-03-08 MED ORDER — AMOXICILLIN 500 MG PO TABS: 500.0000 mg | ORAL_TABLET | Freq: Three times a day (TID) | ORAL | 0 refills | Status: AC

## 2019-03-08 MED ORDER — CIPROFLOXACIN-FLUOCINOLONE PF 0.3-0.025 % OT SOLN: 0.2500 mL | Freq: Two times a day (BID) | OTIC | 0 refills | Status: AC

## 2019-03-08 MED ORDER — CIPROFLOXACIN-DEXAMETHASONE 0.3-0.1 % OT SUSP
4.0000 [drp] | Freq: Once | OTIC | Status: AC
  Administered 2019-03-08: 13:00:00 4 [drp] via OTIC
  Filled 2019-03-08: qty 7.5

## 2019-03-08 NOTE — ED Triage Notes (Signed)
Pt reports an infection and swelling in her ear due to shingles. Pt denies taking any medication at home prior to arrival.

## 2019-03-08 NOTE — Discharge Instructions (Addendum)
You are seen in the ER for new rash and also worsening facial swelling-ear discomfort.  We spoke with the ear specialist.  They would want you to start taking the topical antibiotics as prescribed for your ears.  Also they would want you to place an ear wick in your ear twice a day.  Take the amoxicillin as prescribed and follow up with infectious disease doctor in 1 week.  You will have to call both of those clinics to set up an appointment.  Return to the ER if your symptoms get worse.

## 2019-03-08 NOTE — ED Notes (Signed)
ED Provider at bedside. 

## 2019-03-08 NOTE — ED Provider Notes (Signed)
[facility]-EMERGENCY DEPT Provider Note   CSN: [id number] Arrival date & time: 03/08/19  [id number]     History   Chief Complaint Chief Complaint  Patient presents with  . Herpes Zoster    HPI Sally CowdenYvonne Ramirez is a 73 y.o. female.     HPI  73 year old female with history of diabetes, hypertension, recent admission for shingles and bacteremia comes in a chief complaint of worsening ear pain.  Patient reports that she was discharged few days ago and has been taking gabapentin.  Despite her taking the medicine her earache has gotten worse.  She is also developed a new rash that started 2 days ago.  The rash is itchy.  She denies any exposures or any known history of severe allergies.   Patient was diagnosed with shingles of the face and put on acyclovir.  She thinks she was on prednisone but unsure.  She had subsequent hearing disturbance.  She does not have any tinnitus or vertigo.  However, she has noticed worsening in her hearing and increased swelling around the left side of the face.  I spoke with patient's sister as well.  Past Medical History:  Diagnosis Date  . Anemia   . Diabetes mellitus without complication (HCC)   . Hypertension   . Hypothyroidism   . Neuromuscular disorder [facility](HCC)     Patient Active Problem List   Diagnosis Date Noted  . Shingles 02/24/2019  . Hypertension 02/24/2019  . Type 2 diabetes mellitus (HCC) 02/24/2019  . Neuropathy 02/24/2019  . Normocytic anemia 02/24/2019  . Poor dentition 02/24/2019  . Streptococcal bacteremia 02/23/2019    History reviewed. No pertinent surgical history.   OB History   No obstetric history on file.      Home Medications    Prior to Admission medications   Medication Sig Start Date End Date Taking? Authorizing Provider  amLODipine (NORVASC) 5 MG tablet Take 5 mg by mouth daily. 01/08/19  Yes [provider]  augmented betamethasone dipropionate (DIPROLENE-AF) 0.05 % ointment Apply  1 application topically daily. 02/16/19  Yes [provider]  COMBIGAN 0.2-0.5 % ophthalmic solution Place 1 drop into both eyes 2 (two) times daily.  01/27/19  Yes [provider]  gabapentin (NEURONTIN) 300 MG capsule Take 300 mg by mouth 2 (two) times daily. 02/22/19  Yes [provider]  glimepiride (AMARYL) 4 MG tablet Take 4 mg by mouth daily. 01/29/19  Yes [provider]  hydrochlorothiazide (HYDRODIURIL) 12.5 MG tablet Take 12.5 mg by mouth daily. 01/08/19  Yes [provider]  JARDIANCE 25 MG TABS tablet Take 25 mg by mouth daily. 11/24/18  Yes [provider]  levothyroxine (SYNTHROID) 50 MCG tablet Take 50 mcg by mouth daily. 01/08/19  Yes [provider]  lisinopril (ZESTRIL) 20 MG tablet Take 20 mg by mouth daily. 12/21/18  Yes [provider]  metFORMIN (GLUCOPHAGE) 1000 MG tablet Take 1,000 mg by mouth 2 (two) times daily. 01/08/19  Yes [provider]  amoxicillin (AMOXIL) 500 MG tablet Take 1 tablet (500 mg total) by mouth 3 (three) times daily for 14 days. 03/08/19 03/22/19  Derwood KaplanNanavati, Nonnie Pickney, MD  ciprofloxacin-fluocinolone PF (OTOVEL) 0.3-0.025 % SOLN Place 0.25 mLs into the left ear 2 (two) times daily. 03/08/19   Derwood KaplanNanavati, Jaspreet Hollings, MD    Family History No family history on file.  Social History Social History   Tobacco Use  . Smoking status: Never Smoker  . Smokeless tobacco: Never Used  Substance Use  Topics  . Alcohol use: Not on file  . Drug use: Not on file     Allergies   Patient has no known allergies.   Review of Systems Review of Systems  Constitutional: Positive for activity change. Negative for fever.  Gastrointestinal: Negative for vomiting.  Skin: Positive for rash.  Allergic/Immunologic: Negative for immunocompromised state.  Neurological: Positive for weakness.  All other systems reviewed and are negative.    Physical Exam Updated Vital Signs BP (!) 149/78 (BP Location:  Left Arm)   Pulse 92   Temp 98.8 F (37.1 C) (Oral)   Resp 17   SpO2 100%   Physical Exam Vitals signs and nursing note reviewed.  Constitutional:      Appearance: She is well-developed.  HENT:     Head: Normocephalic and atraumatic.     Comments: Left ear canal has exudative discharge and there is erythema in the ear canal Neck:     Musculoskeletal: Normal range of motion and neck supple.  Cardiovascular:     Rate and Rhythm: Normal rate.  Pulmonary:     Effort: Pulmonary effort is normal.  Abdominal:     General: Bowel sounds are normal.  Skin:    General: Skin is warm and dry.     Findings: Rash present.     Comments: Patient has small pustules diffusely present over both extremities.  I do not see any vesicles with clear fluid.   Neurological:     Mental Status: She is alert and oriented to person, place, and time.      ED Treatments / Results  Labs (all labs ordered are listed, but only abnormal results are displayed) Labs Reviewed  CBC WITH DIFFERENTIAL/PLATELET - Abnormal; Notable for the following components:      Result Value   WBC 12.0 (*)    RBC 2.98 (*)    Hemoglobin 8.7 (*)    HCT 28.2 (*)    Platelets 493 (*)    Neutro Abs 8.5 (*)    All other components within normal limits  CULTURE, BLOOD (ROUTINE X 2)  CULTURE, BLOOD (ROUTINE X 2)  BASIC METABOLIC PANEL    EKG None  Radiology No results found.  Procedures Procedures (including critical care time)  Medications Ordered in ED Medications  ciprofloxacin-dexamethasone (CIPRODEX) 0.3-0.1 % OTIC (EAR) suspension 4 drop (4 drops Left EAR Given 03/08/19 1232)     Initial Impression / Assessment and Plan / ED Course  I have reviewed the triage vital signs and the nursing notes.  Pertinent labs & imaging results that were available during my care of the patient were reviewed by me and considered in my medical decision making (see chart for details).        73 year old comes in a chief  complaint of ear pain.  She is noticing that her hearing is gotten worse and there is some swelling that is worsened around her face and ear.  On exam she has exudative discharge in the ear canal and the ear canal itself is erythematous.  Additionally she has new rash that developed 2 days ago.  Essentially I see small pustular lesions in the upper extremities.   She also had bacteremia admission recently and did not fill up her amoxicillin because of confusion.  I discussed the case with ENT.  Usually we see otitis media as a complication of zoster, but this patient is having findings consistent with otitis externa.  There is no mastoiditis based on our physical exam  and patient is not toxic appearing or having any systemic symptoms.  CT scan not indicated.  Dr. Iverson Alamin E recommends that we start patient on topical Ciprodex and use ear wick.  These recommendations have been discussed with the patient.   Additionally, we will start her on amoxicillin.  We will order 14-day course of it.  Blood cultures will be sent from the ED.  Patient sister wants to follow-up with ID doctors.  Consultation information for both ID and ENT has been provided as needed.  Strict ER return precautions have been discussed, and patient is agreeing with the plan and is comfortable with the workup done and the recommendations from the ER.   Final Clinical Impressions(s) / ED Diagnoses   Final diagnoses:  None    ED Discharge Orders         Ordered    amoxicillin (AMOXIL) 500 MG tablet  3 times daily     03/08/19 1222    ciprofloxacin-fluocinolone PF (OTOVEL) 0.3-0.025 % SOLN  2 times daily     03/08/19 1222           Derwood Kaplan, MD 03/08/19 1306

## 2019-03-13 LAB — CULTURE, BLOOD (ROUTINE X 2)
Culture: NO GROWTH
Culture: NO GROWTH
Special Requests: ADEQUATE
Special Requests: ADEQUATE

## 2019-04-06 DIAGNOSIS — A419 Sepsis, unspecified organism: Secondary | ICD-10-CM | POA: Diagnosis not present

## 2019-04-06 DIAGNOSIS — B0229 Other postherpetic nervous system involvement: Secondary | ICD-10-CM | POA: Diagnosis not present

## 2019-04-28 ENCOUNTER — Other Ambulatory Visit: Payer: Self-pay | Admitting: Family Medicine

## 2019-04-28 DIAGNOSIS — Z1231 Encounter for screening mammogram for malignant neoplasm of breast: Secondary | ICD-10-CM

## 2019-05-17 DIAGNOSIS — E113412 Type 2 diabetes mellitus with severe nonproliferative diabetic retinopathy with macular edema, left eye: Secondary | ICD-10-CM | POA: Diagnosis not present

## 2019-05-17 DIAGNOSIS — Z961 Presence of intraocular lens: Secondary | ICD-10-CM | POA: Diagnosis not present

## 2019-05-17 DIAGNOSIS — E113311 Type 2 diabetes mellitus with moderate nonproliferative diabetic retinopathy with macular edema, right eye: Secondary | ICD-10-CM | POA: Diagnosis not present

## 2019-05-17 DIAGNOSIS — H35371 Puckering of macula, right eye: Secondary | ICD-10-CM | POA: Diagnosis not present

## 2019-05-19 DIAGNOSIS — H7292 Unspecified perforation of tympanic membrane, left ear: Secondary | ICD-10-CM | POA: Diagnosis not present

## 2019-05-19 DIAGNOSIS — H9202 Otalgia, left ear: Secondary | ICD-10-CM | POA: Diagnosis not present

## 2019-05-19 DIAGNOSIS — H6092 Unspecified otitis externa, left ear: Secondary | ICD-10-CM | POA: Diagnosis not present

## 2019-07-01 DIAGNOSIS — R404 Transient alteration of awareness: Secondary | ICD-10-CM | POA: Diagnosis not present

## 2019-07-01 DIAGNOSIS — I469 Cardiac arrest, cause unspecified: Secondary | ICD-10-CM | POA: Diagnosis not present

## 2019-07-26 DIAGNOSIS — 419620001 Death: Secondary | SNOMED CT | POA: Diagnosis not present

## 2019-07-26 DEATH — deceased
